# Patient Record
Sex: Male | Born: 1935 | Race: White | Hispanic: No | Marital: Single | Smoking: Former smoker
Health system: Southern US, Community
[De-identification: ages and names within clinical notes are randomized; demographics above are authoritative.]

## PROBLEM LIST (undated history)

## (undated) DIAGNOSIS — R011 Cardiac murmur, unspecified: Secondary | ICD-10-CM

## (undated) DIAGNOSIS — M1990 Unspecified osteoarthritis, unspecified site: Secondary | ICD-10-CM

## (undated) DIAGNOSIS — I509 Heart failure, unspecified: Secondary | ICD-10-CM

## (undated) HISTORY — DX: Heart failure, unspecified: I50.9

## (undated) HISTORY — DX: Cardiac murmur, unspecified: R01.1

## (undated) HISTORY — DX: Unspecified osteoarthritis, unspecified site: M19.90

---

## 1999-03-09 HISTORY — PX: ARTERIAL BYPASS SURGRY: SHX557

## 1999-03-09 HISTORY — PX: TOTAL HIP ARTHROPLASTY: SHX124

## 2006-07-08 ENCOUNTER — Ambulatory Visit: Payer: Self-pay | Admitting: Cardiology

## 2006-07-08 ENCOUNTER — Inpatient Hospital Stay (HOSPITAL_COMMUNITY): Admission: AD | Admit: 2006-07-08 | Discharge: 2006-07-10 | Payer: Self-pay | Admitting: Gastroenterology

## 2006-07-09 ENCOUNTER — Ambulatory Visit: Payer: Self-pay | Admitting: Internal Medicine

## 2006-07-14 ENCOUNTER — Ambulatory Visit: Payer: Self-pay | Admitting: Gastroenterology

## 2007-03-09 HISTORY — PX: MEDIAL PARTIAL KNEE REPLACEMENT: SHX5965

## 2008-09-05 DEATH — deceased

## 2008-11-30 IMAGING — RF DG ERCP WO/W SPHINCTEROTOMY
1 series · 9 of 9 positions shown · non-contrast
Comparison: none

CLINICAL DATA: None given. 
 ERCP WITH SPHINCTEROTOMY AND BALLOON SWEEP:

[Series 1: run · 9 of 9 slices shown]
[im 1/9]
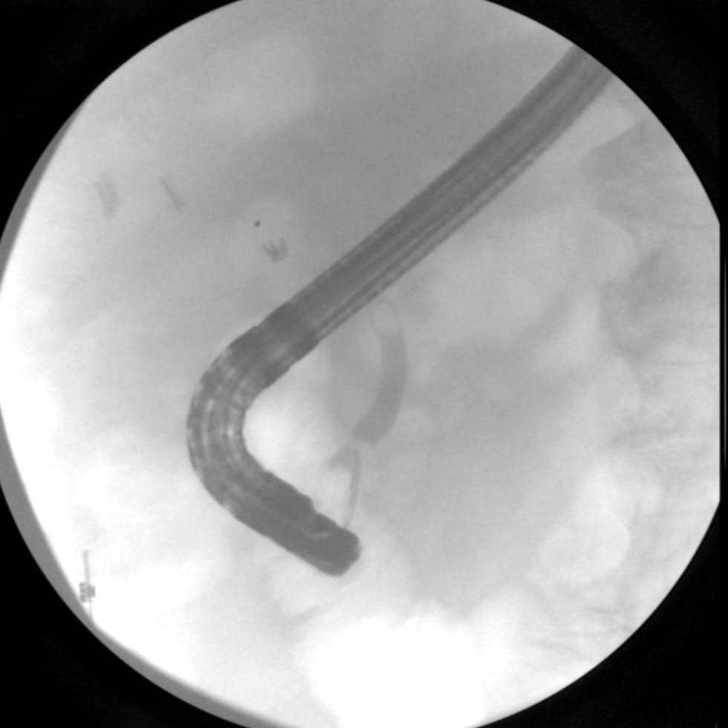
[im 2/9]
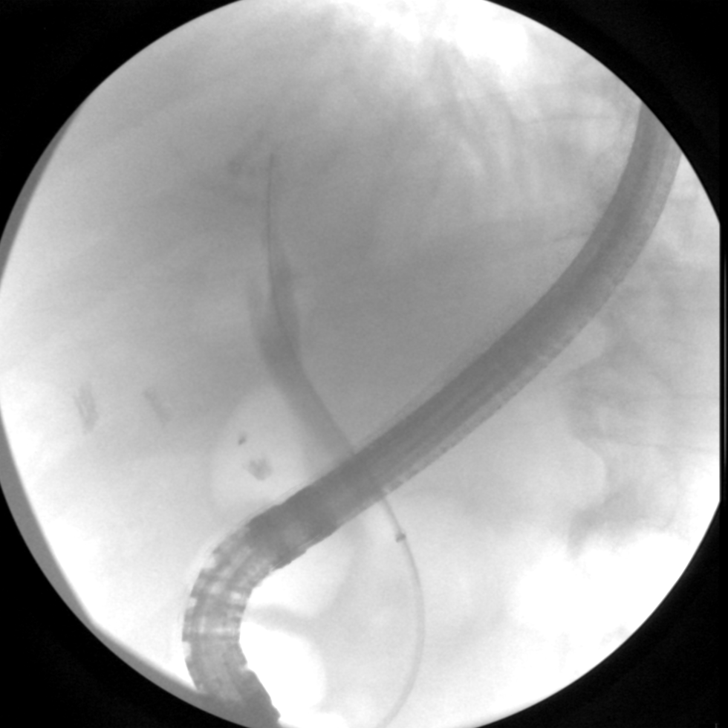
[im 3/9]
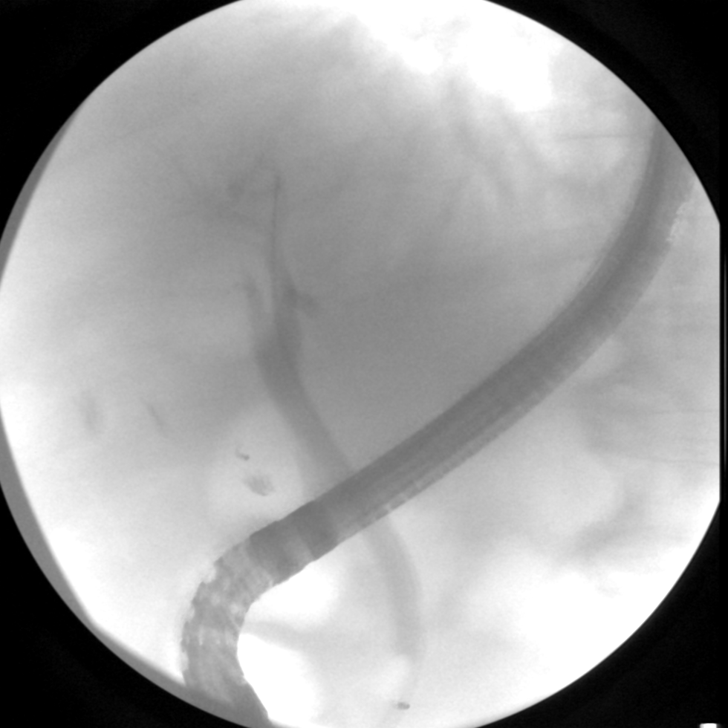
[im 4/9]
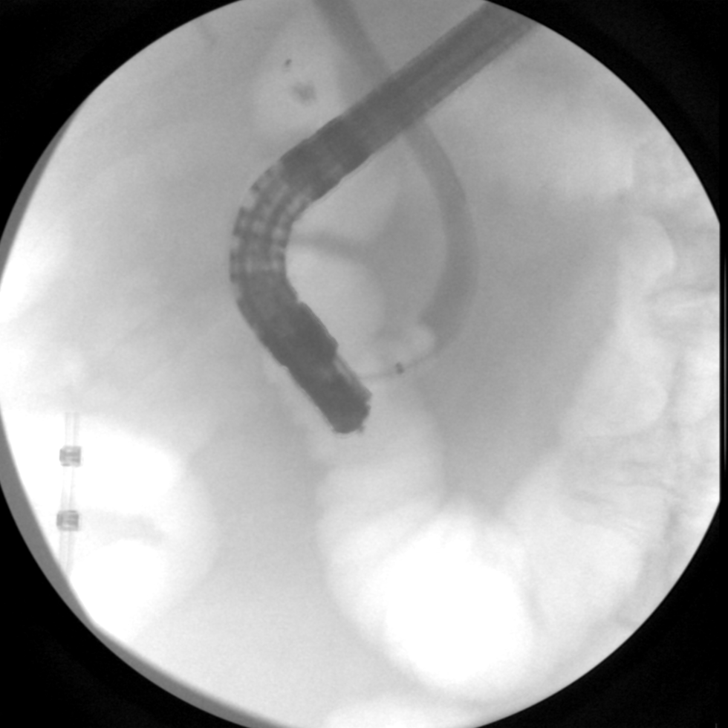
[im 5/9]
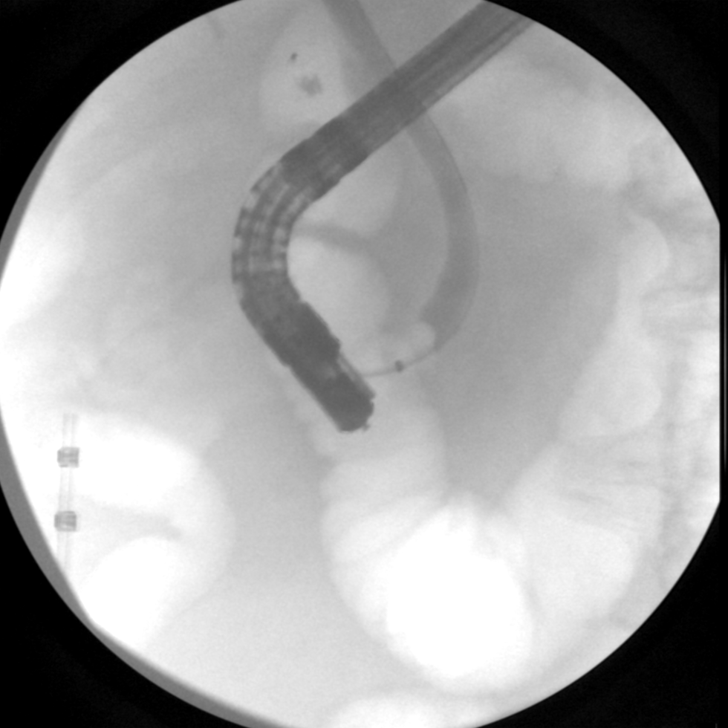
[im 6/9]
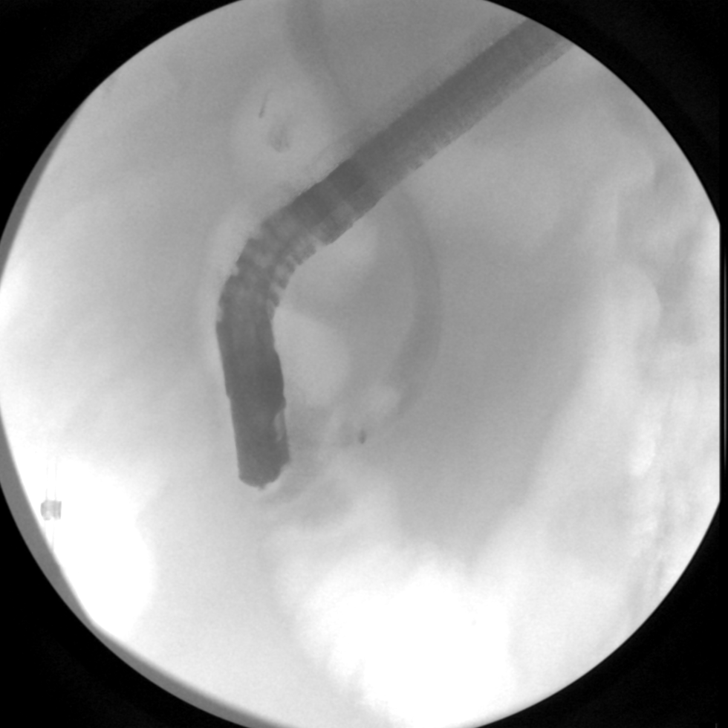
[im 7/9]
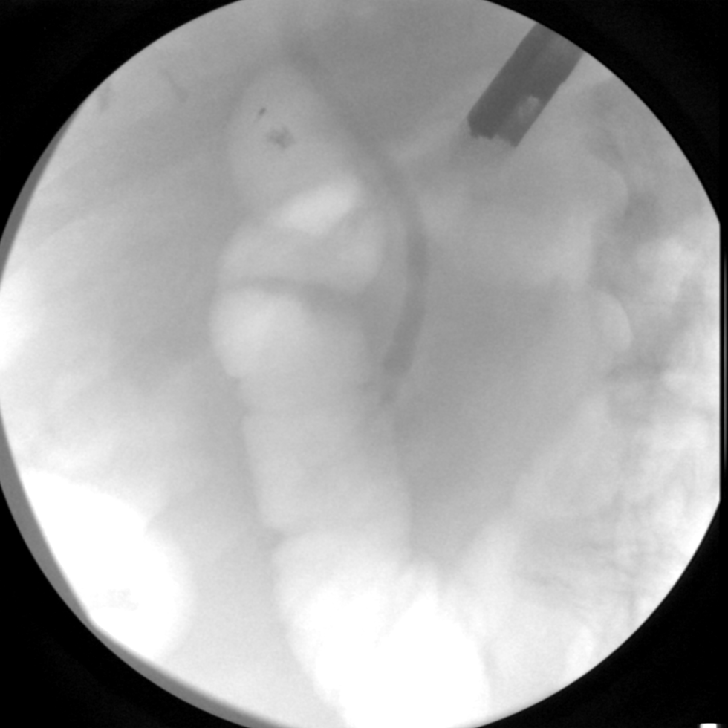
[im 8/9]
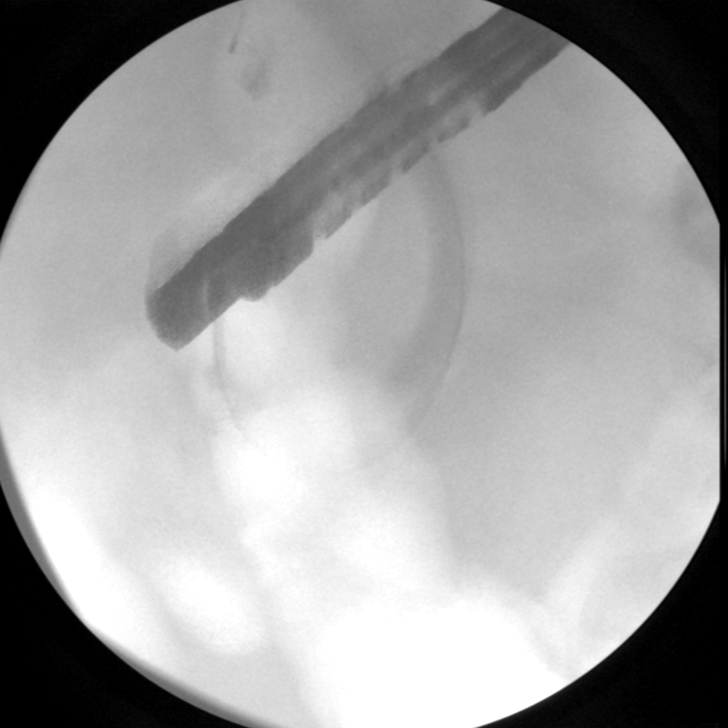
[im 9/9]
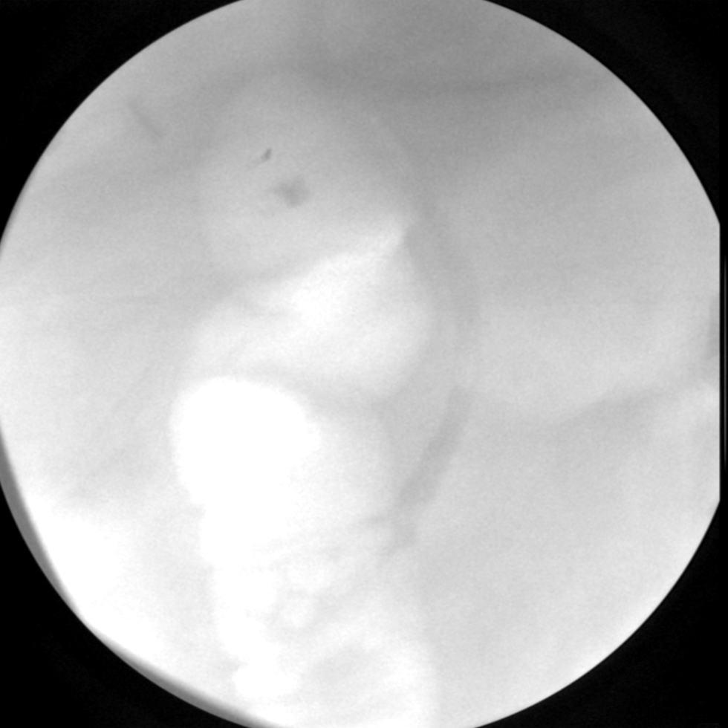

[9 of 9 positions shown; findings below may reference images not displayed]

FINDINGS: Fluoroscopy was provided for Eiland. Bambucafe Tarla during ERCP.  
 Balloon sweeping of the common bile duct was performed. On images obtained after balloon extraction, no obstruction is evident. There are a few air bubbles present.
IMPRESSION: ERCP with sphincterotomy and balloon sweeping.

## 2013-10-29 ENCOUNTER — Ambulatory Visit (INDEPENDENT_AMBULATORY_CARE_PROVIDER_SITE_OTHER): Payer: Medicare Other | Admitting: Cardiovascular Disease

## 2013-10-29 ENCOUNTER — Other Ambulatory Visit (HOSPITAL_COMMUNITY): Payer: Medicare Other

## 2013-10-29 ENCOUNTER — Encounter: Payer: Self-pay | Admitting: Cardiovascular Disease

## 2013-10-29 VITALS — BP 128/75 | HR 103 | Ht 67.0 in | Wt 186.0 lb

## 2013-10-29 DIAGNOSIS — I4891 Unspecified atrial fibrillation: Secondary | ICD-10-CM

## 2013-10-29 DIAGNOSIS — Z951 Presence of aortocoronary bypass graft: Secondary | ICD-10-CM | POA: Insufficient documentation

## 2013-10-29 DIAGNOSIS — Z01810 Encounter for preprocedural cardiovascular examination: Secondary | ICD-10-CM | POA: Insufficient documentation

## 2013-10-29 DIAGNOSIS — I4821 Permanent atrial fibrillation: Secondary | ICD-10-CM | POA: Insufficient documentation

## 2013-10-29 DIAGNOSIS — Z01818 Encounter for other preprocedural examination: Secondary | ICD-10-CM

## 2013-10-29 DIAGNOSIS — R011 Cardiac murmur, unspecified: Secondary | ICD-10-CM

## 2013-10-29 MED ORDER — METOPROLOL TARTRATE 25 MG PO TABS: 25.0000 mg | ORAL_TABLET | Freq: Two times a day (BID) | ORAL | Status: DC

## 2013-10-29 MED ORDER — DILTIAZEM HCL ER COATED BEADS 120 MG PO TB24: 120.0000 mg | ORAL_TABLET | Freq: Every day | ORAL | Status: DC

## 2013-10-29 MED ORDER — METOPROLOL TARTRATE 50 MG PO TABS: 50.0000 mg | ORAL_TABLET | Freq: Two times a day (BID) | ORAL | Status: DC

## 2013-10-29 NOTE — Assessment & Plan Note (Signed)
Activity limited by him pain Moderate risk ortho surgery Exertional dyspnea with abnormal ECG inferior T wave changes  F/U lexiscan myovue

## 2013-10-29 NOTE — Assessment & Plan Note (Signed)
AV sclerosis However has history of CAD, ? CHF, Afib and is preop  Will check echo especially for EF

## 2013-10-29 NOTE — Patient Instructions (Addendum)
Your physician recommends that you schedule a follow-up appointment in: November  WITH  DR [facility] Your physician has recommended you make the following change in your medication: CARDIZEM 120 MG  Your physician has requested that you have a lexiscan myoview. For further information please visit [email or web address] Please follow instruction sheet, as given.   Your physician has requested that you have an echocardiogram. Echocardiography is a painless test that uses sound waves to create images of your heart. It provides your doctor with information about the size and shape of your heart and how well your heart's chambers and valves are working. This procedure takes approximately one hour. There are no restrictions for this procedure.

## 2013-10-29 NOTE — Assessment & Plan Note (Signed)
Needs better rate control especially preop with CAD  Increase beta blocker to bid and add cardizem  Long discussion with him about the cumulative risk of stroke  He absolutely refuses to consider coumadin or NOAC long term  Understands that he will be on them short term for DVT prophylaxis with ortho surgery

## 2013-10-29 NOTE — Progress Notes (Signed)
Patient ID: Douglas Hicks, male   DOB: 03-01-1931, 80 y.o.   MRN: [id number]    80 yo referred for preop clearance  Unfortunately we have no records He needs repeat right hip surgery . Had right knee surgery in early 2000 and hip surgery after.  No cardiac complications.  He indicates having afib for years.  Refuses to take anticoagulation.  Is on beta blocker for rate control but in office at rest pulse is 105.  Has CAD and had CABG in 2009  Indicates history of CHF.  About a month ago fell and right hip much worse  Came to clinic using crutches.  Needs clarence for surgery of right hip 9/22  Activity is limited By pain  Mild exertional dyspnea no chest pain Does not note palpitations or being in afib  Compliant with meds.  Does not like regular f/u with MD;s  No previous stroke , DVT bleeding diathesis or anesthetic problem.       ROS: Denies fever, malais, weight loss, blurry vision, decreased visual acuity, cough, sputum, SOB, hemoptysis, pleuritic pain, palpitaitons, heartburn, abdominal pain, melena, lower extremity edema, claudication, or rash.  All other systems reviewed and negative   General: Affect appropriate Healthy:  appears stated age HEENT: normal Neck supple with no adenopathy JVP normal no bruits no thyromegaly Lungs clear with no wheezing and good diaphragmatic motion Heart:  S1/S2 SEM  murmur,rub, gallop or click PMI normal Abdomen: benighn, BS positve, no tenderness, no AAA no bruit.  No HSM or HJR Distal pulses intact with no bruits No edema Neuro non-focal Skin warm and dry No muscular weakness  Medications Current Outpatient Prescriptions  Medication Sig Dispense Refill  . acetaminophen (TYLENOL) 325 MG tablet Take 650 mg by mouth every 6 (six) hours as needed.      Marland Kitchen aspirin 325 MG EC tablet Take 325 mg by mouth daily.      Marland Kitchen desonide (DESOWEN) 0.05 % cream Apply 1 application topically 2 (two) times daily.      Marland Kitchen diltiazem (CARDIZEM LA) 120 MG 24 hr  tablet Take 1 tablet (120 mg total) by mouth daily.  30 tablet  11  . fluocinonide cream (LIDEX) 0.05 % Apply 1 application topically 2 (two) times daily.      Marland Kitchen guaiFENesin (ROBITUSSIN) 100 MG/5ML liquid Take 100 mg by mouth at bedtime.      . hydroxypropyl methylcellulose (ISOPTO TEARS) 2.5 % ophthalmic solution Place into the right eye 2 (two) times daily.      . metoprolol tartrate (LOPRESSOR) 50 MG tablet Take 1 tablet (50 mg total) by mouth 2 (two) times daily.      Marland Kitchen omeprazole (PRILOSEC) 20 MG capsule Take 20 mg by mouth daily.      . valsartan (DIOVAN) 40 MG tablet Take 20 mg by mouth 2 (two) times daily.       No current facility-administered medications for this visit.    Allergies Review of patient's allergies indicates not on file.  Family History: No family history on file.  Social History: History   Social History  . Marital Status: Single    Spouse Name: N/A    Number of Children: N/A  . Years of Education: N/A   Occupational History  . Not on file.   Social History Main Topics  . Smoking status: Not on file  . Smokeless tobacco: Not on file  . Alcohol Use: Not on file  . Drug Use: Not on file  .  Sexual Activity: Not on file   Other Topics Concern  . Not on file   Social History Narrative  . No narrative on file    Electrocardiogram: afib rate 103  Inferior T wave changes   Assessment and Plan

## 2013-10-31 ENCOUNTER — Ambulatory Visit (HOSPITAL_COMMUNITY): Payer: Medicare Other | Attending: Cardiovascular Disease | Admitting: Radiology

## 2013-10-31 ENCOUNTER — Telehealth: Payer: Self-pay | Admitting: *Deleted

## 2013-10-31 ENCOUNTER — Telehealth: Payer: Self-pay | Admitting: Cardiovascular Disease

## 2013-10-31 ENCOUNTER — Other Ambulatory Visit: Payer: Self-pay

## 2013-10-31 ENCOUNTER — Other Ambulatory Visit: Payer: Self-pay | Admitting: *Deleted

## 2013-10-31 ENCOUNTER — Encounter: Payer: Self-pay | Admitting: Cardiovascular Disease

## 2013-10-31 ENCOUNTER — Ambulatory Visit (HOSPITAL_BASED_OUTPATIENT_CLINIC_OR_DEPARTMENT_OTHER): Payer: Medicare Other | Admitting: Radiology

## 2013-10-31 VITALS — BP 152/104 | HR 83 | Ht 67.0 in | Wt 184.0 lb

## 2013-10-31 DIAGNOSIS — R011 Cardiac murmur, unspecified: Secondary | ICD-10-CM

## 2013-10-31 DIAGNOSIS — I4891 Unspecified atrial fibrillation: Secondary | ICD-10-CM

## 2013-10-31 DIAGNOSIS — I2510 Atherosclerotic heart disease of native coronary artery without angina pectoris: Secondary | ICD-10-CM | POA: Diagnosis present

## 2013-10-31 DIAGNOSIS — Z01818 Encounter for other preprocedural examination: Secondary | ICD-10-CM

## 2013-10-31 DIAGNOSIS — R0602 Shortness of breath: Secondary | ICD-10-CM

## 2013-10-31 DIAGNOSIS — Z01810 Encounter for preprocedural cardiovascular examination: Secondary | ICD-10-CM | POA: Insufficient documentation

## 2013-10-31 DIAGNOSIS — Z951 Presence of aortocoronary bypass graft: Secondary | ICD-10-CM

## 2013-10-31 MED ORDER — DILTIAZEM HCL ER COATED BEADS 120 MG PO CP24: 120.0000 mg | ORAL_CAPSULE | Freq: Every day | ORAL | Status: DC

## 2013-10-31 MED ORDER — TECHNETIUM TC 99M SESTAMIBI GENERIC - CARDIOLITE
33.0000 | Freq: Once | INTRAVENOUS | Status: AC | PRN
  Administered 2013-10-31: 33 via INTRAVENOUS

## 2013-10-31 MED ORDER — TECHNETIUM TC 99M SESTAMIBI GENERIC - CARDIOLITE
11.0000 | Freq: Once | INTRAVENOUS | Status: AC | PRN
  Administered 2013-10-31: 11 via INTRAVENOUS

## 2013-10-31 MED ORDER — REGADENOSON 0.4 MG/5ML IV SOLN
0.4000 mg | Freq: Once | INTRAVENOUS | Status: AC
  Administered 2013-10-31: 0.4 mg via INTRAVENOUS

## 2013-10-31 NOTE — Telephone Encounter (Signed)
Pt daughther is here with dad for testing. Wanted to know about meds that were suppose to be called in and have not yet been called. Please call or she said she was going to be in waiting room.

## 2013-10-31 NOTE — Telephone Encounter (Signed)
INS REQUIRED PRIOR AUTH  FOR  CARDIZEM LA  DISCUSSED  WITH PHARMACY  MED  WOULD  BE $130.OO  PER  MONTH  BUT INS  WOULD  COVER   DILTIAZEM  CD  120 MG   AT  A  COST   OF  ABOUT  $22.00  PER  MONTH  REVIEWED WITH DR Eden Emms  MAY   SWITCH  MED  TO  DILTIAZEM CD  NEW  SCRIPT  SENT  VIA  EPIC  PT'S  DAUGHTER  AWARE .Zack Seal

## 2013-10-31 NOTE — Progress Notes (Signed)
Echocardiogram performed.  

## 2013-10-31 NOTE — Telephone Encounter (Signed)
UNABLE  TO LEAVE  MESSAGE PER  DR NISHAN PT  MAY PROCEED WITH  HIP  SURGERY  ECHO  STABLE SINCE  2008  AND  MYOVIEW  NO  ISCHEMIA   NEEDS  CARDIOLOGY  TO  SEE  POST OP  AND  PT   REALLY  NEEDS  TO VOICE  THIS  PT  NOT  HAVING SURGERY  AT  [facility]   DR  NISHAN  WOULD LIKE PT    TO  HAVE  COPY  OF  OFFICE  VISIT  ,ECHO , AND  MYOVIEW   ATTEMPTED   TO  INFORM DAUGHTER    WILL  FORWARD   MESSAGE  TO TRY  AGAIN Douglas Hicks .Douglas Hicks

## 2013-10-31 NOTE — Progress Notes (Signed)
[facility] SITE 3 NUCLEAR MED [address] [place], [place] [address] [phone]    Cardiology Nuclear Med Study  Douglas Hicks is a 80 y.o. male     MRN : [id number]     DOB: [birth year]  Procedure Date: 10/31/2013  Nuclear Med Background Indication for Stress Test:  Evaluation for Ischemia and Surgical Clearance- Hip surgery @ [facility] History:  CAD, CABG 2009, Afib, hx. CHF Cardiac Risk Factors: History of Smoking  Symptoms:  DOE and Palpitations   Nuclear Pre-Procedure Caffeine/Decaff Intake:  None NPO After: 8:00pm   Lungs:  clear O2 Sat: 96% on room air. IV 0.9% NS with Angio Cath:  22g  IV Site: R Hand  IV Started by:  Bonnita Levan, RN  Chest Size (in):  44 Cup Size: n/a  Height:  (1.702 m)  Weight:  184 lb (83.462 kg)  BMI:  Body mass index is 28.81 kg/(m^2). Tech Comments:  N/A    Nuclear Med Study 1 or 2 day study: 1 day  Stress Test Type:  Lexiscan  Reading MD: N/A  Order Authorizing Provider:  Charlton Haws, MD  Resting Radionuclide: Technetium 46m Sestamibi  Resting Radionuclide Dose: 11.0 mCi   Stress Radionuclide:  Technetium 78m Sestamibi  Stress Radionuclide Dose: 33.0 mCi           Stress Protocol Rest HR: 83 Stress HR: 107  Rest BP: 152/104 Stress BP: 152/89  Exercise Time (min): n/a METS: n/a           Dose of Adenosine (mg):  n/a Dose of Lexiscan: 0.4 mg  Dose of Atropine (mg): n/a Dose of Dobutamine: n/a mcg/kg/min (at max HR)  Stress Test Technologist: Nelson Chimes, BS-ES  Nuclear Technologist:  Harlow Asa, CNMT     Rest Procedure:  Myocardial perfusion imaging was performed at rest 45 minutes following the intravenous administration of Technetium 60m Sestamibi. Rest ECG: Atrial Fibrilliation  Stress Procedure:  The patient received IV Lexiscan 0.4 mg over 15-seconds.  Technetium 21m Sestamibi injected at 30-seconds.  Quantitative spect images were obtained after a 45 minute delay.  During the infusion of  Lexiscan the patient complained of feeling funny.  This resolved in recovery.  Stress ECG: No significant change from baseline ECG  QPS Raw Data Images:  Normal; no motion artifact; normal heart/lung ratio. Stress Images:  Large, severe inferior and basal inferolateral perfusion defect. Rest Images:  Large, severe inferior and basal inferolateral perfusion defect Subtraction (SDS):  Fixed, large, severe inferior and basal inferolateral perfusion defect Transient Ischemic Dilatation (Normal <1.22):  1.06 Lung/Heart Ratio (Normal <0.45):  0.38  Quantitative Gated Spect Images QGS EDV:  143 ml QGS ESV:  106 ml  Impression Exercise Capacity:  Lexiscan with no exercise. BP Response:  Normal blood pressure response. Clinical Symptoms:  "Feels funny" ECG Impression:  Atrial fibrillation, no ischemic changes.  Comparison with Prior Nuclear Study: No images to compare  Overall Impression:  Intermediate risk stress nuclear study with large, severe inferior and basal inferolateral perfusion defect that was fixed.  No ischemia.  EF 25% with diffuse hypokinesis and inferior akinesis. .  LV Ejection Fraction: 25%.  LV Wall Motion:  Diffuse hypokinesis with inferior akinesis.   [name] 10/31/2013

## 2013-11-01 NOTE — Telephone Encounter (Signed)
Completed by Carlyon Shadow, RN

## 2013-11-01 NOTE — Telephone Encounter (Addendum)
Spoke with patient's daughter. Advised as noted below that he needs to see cardiology post op surgery in [place]. She will pick up copies of the office note and study today. Left at front desk. Wants to know if clearance letter was faxed to [facility]. Found clearance letter and faxed to Tina at [phone] along with copy of last office note and echo. Also left copy of this clearance for daughter to pick up.

## 2013-11-01 NOTE — Telephone Encounter (Signed)
Patient's daughter is returning call. Please call and advise.  

## 2013-11-07 ENCOUNTER — Encounter: Payer: Self-pay | Admitting: Cardiovascular Disease

## 2014-01-21 ENCOUNTER — Ambulatory Visit: Payer: Medicare Other | Admitting: Cardiovascular Disease

## 2014-05-29 DIAGNOSIS — M1612 Unilateral primary osteoarthritis, left hip: Secondary | ICD-10-CM | POA: Diagnosis not present

## 2014-05-29 DIAGNOSIS — Z96642 Presence of left artificial hip joint: Secondary | ICD-10-CM | POA: Diagnosis not present

## 2018-08-07 DEATH — deceased

## 2021-07-02 ENCOUNTER — Other Ambulatory Visit: Payer: Self-pay | Admitting: Neurosurgery

## 2021-07-07 ENCOUNTER — Encounter (HOSPITAL_COMMUNITY): Payer: Self-pay | Admitting: Neurosurgery

## 2021-07-07 NOTE — Anesthesia Preprocedure Evaluation (Addendum)
Anesthesia Evaluation  ?Patient identified by MRN, date of birth, ID band ?Patient awake ? ? ? ?Reviewed: ?Allergy & Precautions, NPO status , Patient's Chart, lab work & pertinent test results, reviewed documented beta blocker date and time  ? ?History of Anesthesia Complications ?Negative for: history of anesthetic complications ? ?Airway ?Mallampati: II ? ?TM Distance: >3 FB ?Neck ROM: Full ? ? ? Dental ? ?(+) Edentulous Lower, Edentulous Upper ?  ?Pulmonary ?former smoker,  ?  ?Pulmonary exam normal ? ? ? ? ? ? ? Cardiovascular ?+ CAD, + CABG and +CHF  ?Normal cardiovascular exam+ dysrhythmias Atrial Fibrillation  ? ? ?'23 TTE - EF 30-35%. Moderate global hypokinesis of the left ventricle. Inferior wall akinesis. Diastolic dysfunction, Grade III (restrictive pattern). RV function appears depressed. Both atria are mildly dilated. Mild AI.  ? ?  ?Neuro/Psych ?negative neurological ROS ? negative psych ROS  ? GI/Hepatic ?negative GI ROS, Neg liver ROS,   ?Endo/Other  ?negative endocrine ROS ? Renal/GU ?negative Renal ROS  ? ?  ?Musculoskeletal ? ?(+) Arthritis ,  ? Abdominal ?  ?Peds ? Hematology ?negative hematology ROS ?(+)   ?Anesthesia Other Findings ? ? Reproductive/Obstetrics ? ?  ? ? ? ? ? ? ? ? ? ? ? ? ? ?  ?  ? ? ? ? ? ? ?Anesthesia Physical ?Anesthesia Plan ? ?ASA: 3 ? ?Anesthesia Plan: General  ? ?Post-op Pain Management: Tylenol PO (pre-op)*  ? ?Induction: Intravenous ? ?PONV Risk Score and Plan: 2 and Treatment may vary due to age or medical condition, Ondansetron and Propofol infusion ? ?Airway Management Planned: Oral ETT ? ?Additional Equipment:  ? ?Intra-op Plan:  ? ?Post-operative Plan: Extubation in OR ? ?Informed Consent: I have reviewed the patients History and Physical, chart, labs and discussed the procedure including the risks, benefits and alternatives for the proposed anesthesia with the patient or authorized representative who has indicated his/her  understanding and acceptance.  ? ? ? ?Dental advisory given ? ?Plan Discussed with: CRNA and Anesthesiologist ? ?Anesthesia Plan Comments: (Plan for [facility] ?)  ? ? ? ? ?Anesthesia Quick Evaluation ? ?

## 2021-07-07 NOTE — Progress Notes (Signed)
Anesthesia Chart Review: ?Same-day work-up ? ?Follows with cardiology at the [facility] for history of CAD s/p CABG, ischemic cardiomyopathy (EF 30 to 35% on echo 06/26/2021, prior echo 11/28/2017 showed EF 20 to 25%), Permanent AF on Xarelto, HTN, HLD.  Seen by Dr. Stevphen Rochester 06/19/2021 for preop evaluation.  Updated echo was ordered to reevaluate LV function and cardiac valves.  Note was addended after completion of echo and patient was cleared.  Per note, "06/26/2021 ADDENDUM STATUS: COMPLETED Formal echo completed, demonstrated EF 30-35%, grade III diastolic dysfunction, inferior wall akinesis. Called to provide the results of his study but I was unable to reach the veteran.  Looking back at his weight trend, he is down nearly 20 lbs from 2017. Clinically, he appeared euvolemic in clinic. We continued his BB and he was started on an ARB for better BP control. Given his echo findings, I would consider him to be moderate risk for peri-operative risk of major adverse cardiac event or death. However, there is no further intervention or medical optimization from a cardiac perspective at this point in time." ? ?Per pt's wife, pt not taking Xarelto.  ? ?Patient will need day of surgery labs and evaluation. ? ?EKG 06/19/2021 (per cardiology note same date): Atrial fibrillation, rate controlled 81, inferolateral T wave changes. ? ?TTE 06/26/2021 (Care Everywhere): ?Summary Statements ? ?The left ventricle is normal in size. ?There is normal left ventricular wall thickness. ? ?Ejection Fraction = 30-35% (Visual Estimation). ?EF= 34% by 2D biplane method. ?There is moderate global hypokinesis of the left ventricle. ? ?Inferior wall akinesis. ?Diastolic dysfunction, Grade III (restrictive pattern), consistent with ? ?markedly increased left atrial pressure. ?RV function appears depressed. ?TAPSE is 1.3 cm. ? ?The left atrium is mildly dilated. ?The right atrium is mildly dilated. ?There is moderate aortic valve thickening. ?No  hemodynamically significant valvular aortic stenosis. ? ?Mild aortic regurgitation. ?There is mild mitral annular calcification. ?Right ventricular systolic pressure is normal. ?The aortic root is normal size. ? ?There is no pericardial effusion. ? ? ? ? ?Antionette Poles, PA-C ?[facility] Short Stay [facility]/Anesthesiology ?Phone [phone] ?07/07/2021 11:18 AM ? ?

## 2021-07-07 NOTE — Progress Notes (Signed)
PCP - Dr. Jethro Poling at [facility]  ?Cardiologist -  ?EKG - DOS ?Chest x-ray -  ?ECHO -  ?Cardiac Cath -  ? ?Blood Thinner Instructions:  ?Aspirin Instructions: per pt wife 07/03/21 ?ERAS Protcol - n/a ?COVID TEST- n/a ? ?Anesthesia review: n/a ? ?------------- ? ?SDW INSTRUCTIONS: ? ?Your procedure is scheduled on Wednesday 5/3. Please report to [facility] Main Entrance "A" at 0630 A.M., and check in at the Admitting office. Call this number if you have problems the morning of surgery: [phone] ? ? ?Remember: Do not eat or drink after midnight the night before your surgery ?  ?Medications to take morning of surgery with a sip of water include: ?metoprolol (TOPROL-XL)  ?acetaminophen (TYLENOL)  if needed ? ?As of today, STOP taking any Aleve, Naproxen, Ibuprofen, Motrin, Advil, Goody's, BC's, all herbal medications, fish oil, and all vitamins. ? ?  ?The Morning of Surgery ?Do not wear jewelry ?Do not wear lotions, powders, colognes, or deodorant ?Do not bring valuables to the hospital. ?[facility] is not responsible for any belongings or valuables. ? ?If you are a smoker, DO NOT Smoke 24 hours prior to surgery ? ?If you wear a CPAP at night please bring your mask the morning of surgery  ? ?Remember that you must have someone to transport you home after your surgery, and remain with you for 24 hours if you are discharged the same day. ? ?Please bring cases for contacts, glasses, hearing aids, dentures or bridgework because it cannot be worn into surgery.  ? ?Patients discharged the day of surgery will not be allowed to drive home.  ? ?Please shower the NIGHT BEFORE/MORNING OF SURGERY (use antibacterial soap like DIAL soap if possible). Wear comfortable clothes the morning of surgery. Oral Hygiene is also important to reduce your risk of infection.  Remember - BRUSH YOUR TEETH THE MORNING OF SURGERY WITH YOUR REGULAR TOOTHPASTE ? ?Patient denies shortness of breath, fever, cough and chest pain.  ? ? ?   ? ?

## 2021-07-08 ENCOUNTER — Ambulatory Visit (HOSPITAL_COMMUNITY): Payer: No Typology Code available for payment source | Admitting: Physician Assistant

## 2021-07-08 ENCOUNTER — Encounter (HOSPITAL_COMMUNITY)
Admission: RE | Disposition: A | Discharge status: Home / Self Care | Payer: Self-pay | Source: Home / Self Care | Attending: Neurosurgery

## 2021-07-08 ENCOUNTER — Ambulatory Visit (HOSPITAL_COMMUNITY): Payer: No Typology Code available for payment source

## 2021-07-08 ENCOUNTER — Encounter (HOSPITAL_COMMUNITY): Payer: Self-pay | Admitting: Neurosurgery

## 2021-07-08 ENCOUNTER — Ambulatory Visit (HOSPITAL_COMMUNITY)
Admission: RE | Admit: 2021-07-08 | Discharge: 2021-07-09 | Disposition: A | Discharge status: Home / Self Care | Payer: No Typology Code available for payment source | Attending: Neurosurgery | Admitting: Neurosurgery

## 2021-07-08 ENCOUNTER — Ambulatory Visit (HOSPITAL_BASED_OUTPATIENT_CLINIC_OR_DEPARTMENT_OTHER): Payer: No Typology Code available for payment source | Admitting: Physician Assistant

## 2021-07-08 ENCOUNTER — Other Ambulatory Visit: Payer: Self-pay

## 2021-07-08 DIAGNOSIS — I2510 Atherosclerotic heart disease of native coronary artery without angina pectoris: Secondary | ICD-10-CM

## 2021-07-08 DIAGNOSIS — M5416 Radiculopathy, lumbar region: Secondary | ICD-10-CM | POA: Insufficient documentation

## 2021-07-08 DIAGNOSIS — Z951 Presence of aortocoronary bypass graft: Secondary | ICD-10-CM | POA: Diagnosis not present

## 2021-07-08 DIAGNOSIS — M48062 Spinal stenosis, lumbar region with neurogenic claudication: Secondary | ICD-10-CM

## 2021-07-08 DIAGNOSIS — Z87891 Personal history of nicotine dependence: Secondary | ICD-10-CM | POA: Diagnosis not present

## 2021-07-08 DIAGNOSIS — I509 Heart failure, unspecified: Secondary | ICD-10-CM | POA: Diagnosis not present

## 2021-07-08 HISTORY — PX: LUMBAR LAMINECTOMY/DECOMPRESSION MICRODISCECTOMY: SHX5026

## 2021-07-08 LAB — POCT I-STAT, CHEM 8
BUN: 23 mg/dL (ref 8–23)
Calcium, Ion: 1.22 mmol/L (ref 1.15–1.40)
Chloride: 105 mmol/L (ref 98–111)
Creatinine, Ser: 0.8 mg/dL (ref 0.61–1.24)
Glucose, Bld: 134 mg/dL — ABNORMAL HIGH (ref 70–99)
HCT: 49 % (ref 39.0–52.0)
Hemoglobin: 16.7 g/dL (ref 13.0–17.0)
Potassium: 4.8 mmol/L (ref 3.5–5.1)
Sodium: 139 mmol/L (ref 135–145)
TCO2: 27 mmol/L (ref 22–32)

## 2021-07-08 LAB — SURGICAL PCR SCREEN
MRSA, PCR: NEGATIVE
Staphylococcus aureus: POSITIVE — AB

## 2021-07-08 SURGERY — LUMBAR LAMINECTOMY/DECOMPRESSION MICRODISCECTOMY 2 LEVELS
Anesthesia: General | Site: Spine Lumbar | Laterality: Bilateral

## 2021-07-08 MED ORDER — LACTATED RINGERS IV SOLN: INTRAVENOUS | Status: DC

## 2021-07-08 MED ORDER — PHENYLEPHRINE 80 MCG/ML (10ML) SYRINGE FOR IV PUSH (FOR BLOOD PRESSURE SUPPORT)
PREFILLED_SYRINGE | INTRAVENOUS | Status: DC | PRN
Start: 2021-07-08 — End: 2021-07-08
  Administered 2021-07-08: 40 ug via INTRAVENOUS
  Administered 2021-07-08: 80 ug via INTRAVENOUS

## 2021-07-08 MED ORDER — FENTANYL CITRATE (PF) 250 MCG/5ML IJ SOLN
INTRAMUSCULAR | Status: AC
  Filled 2021-07-08: qty 5

## 2021-07-08 MED ORDER — CHLORHEXIDINE GLUCONATE 0.12 % MT SOLN
OROMUCOSAL | Status: AC
  Administered 2021-07-08: 15 mL via OROMUCOSAL
  Filled 2021-07-08: qty 15

## 2021-07-08 MED ORDER — BACITRACIN ZINC 500 UNIT/GM EX OINT
TOPICAL_OINTMENT | CUTANEOUS | Status: AC
  Filled 2021-07-08: qty 28.35

## 2021-07-08 MED ORDER — 0.9 % SODIUM CHLORIDE (POUR BTL) OPTIME
TOPICAL | Status: DC | PRN
  Administered 2021-07-08: 1000 mL

## 2021-07-08 MED ORDER — BUPIVACAINE-EPINEPHRINE (PF) 0.5% -1:200000 IJ SOLN
INTRAMUSCULAR | Status: DC | PRN
  Administered 2021-07-08: 20 mL via PERINEURAL

## 2021-07-08 MED ORDER — CEFAZOLIN SODIUM-DEXTROSE 2-4 GM/100ML-% IV SOLN
INTRAVENOUS | Status: AC
  Filled 2021-07-08: qty 100

## 2021-07-08 MED ORDER — CEFAZOLIN SODIUM-DEXTROSE 2-3 GM-%(50ML) IV SOLR
INTRAVENOUS | Status: DC | PRN
  Administered 2021-07-08: 2 g via INTRAVENOUS

## 2021-07-08 MED ORDER — MUPIROCIN 2 % EX OINT
1.0000 "application " | TOPICAL_OINTMENT | Freq: Two times a day (BID) | CUTANEOUS | Status: DC
  Administered 2021-07-08 – 2021-07-09 (×2): 1 via NASAL
  Filled 2021-07-08: qty 22

## 2021-07-08 MED ORDER — MORPHINE SULFATE (PF) 4 MG/ML IV SOLN: 4.0000 mg | INTRAVENOUS | Status: DC | PRN

## 2021-07-08 MED ORDER — ACETAMINOPHEN 500 MG PO TABS
1000.0000 mg | ORAL_TABLET | Freq: Four times a day (QID) | ORAL | Status: AC
  Administered 2021-07-08 – 2021-07-09 (×2): 1000 mg via ORAL
  Filled 2021-07-08 (×3): qty 2

## 2021-07-08 MED ORDER — PROPOFOL 10 MG/ML IV BOLUS
INTRAVENOUS | Status: AC
  Filled 2021-07-08: qty 20

## 2021-07-08 MED ORDER — THROMBIN 5000 UNITS EX SOLR
CUTANEOUS | Status: DC | PRN
  Administered 2021-07-08: 5000 [IU] via TOPICAL

## 2021-07-08 MED ORDER — LIDOCAINE 2% (20 MG/ML) 5 ML SYRINGE
INTRAMUSCULAR | Status: DC | PRN
Start: 2021-07-08 — End: 2021-07-08
  Administered 2021-07-08: 60 mg via INTRAVENOUS

## 2021-07-08 MED ORDER — ONDANSETRON HCL 4 MG/2ML IJ SOLN: 4.0000 mg | Freq: Once | INTRAMUSCULAR | Status: DC | PRN

## 2021-07-08 MED ORDER — SODIUM CHLORIDE 0.9% FLUSH: 3.0000 mL | INTRAVENOUS | Status: DC | PRN

## 2021-07-08 MED ORDER — BACITRACIN ZINC 500 UNIT/GM EX OINT
TOPICAL_OINTMENT | CUTANEOUS | Status: DC | PRN
  Administered 2021-07-08: 1 via TOPICAL

## 2021-07-08 MED ORDER — ROCURONIUM BROMIDE 10 MG/ML (PF) SYRINGE
PREFILLED_SYRINGE | INTRAVENOUS | Status: AC
  Filled 2021-07-08: qty 10

## 2021-07-08 MED ORDER — PROPOFOL 10 MG/ML IV BOLUS
INTRAVENOUS | Status: DC | PRN
Start: 2021-07-08 — End: 2021-07-08
  Administered 2021-07-08: 80 mg via INTRAVENOUS

## 2021-07-08 MED ORDER — CARBOXYMETHYLCELLUL-GLYCERIN 0.5-0.9 % OP SOLN
1.0000 [drp] | Freq: Four times a day (QID) | OPHTHALMIC | Status: DC | PRN
Start: 2021-07-08 — End: 2021-07-08

## 2021-07-08 MED ORDER — SODIUM CHLORIDE 0.9% FLUSH
3.0000 mL | Freq: Two times a day (BID) | INTRAVENOUS | Status: DC
  Administered 2021-07-08: 3 mL via INTRAVENOUS

## 2021-07-08 MED ORDER — ACETAMINOPHEN 500 MG PO TABS
1000.0000 mg | ORAL_TABLET | Freq: Once | ORAL | Status: AC
  Administered 2021-07-08: 1000 mg via ORAL
  Filled 2021-07-08: qty 2

## 2021-07-08 MED ORDER — SODIUM CHLORIDE 0.9 % IV SOLN: 250.0000 mL | INTRAVENOUS | Status: DC

## 2021-07-08 MED ORDER — ROCURONIUM BROMIDE 10 MG/ML (PF) SYRINGE
PREFILLED_SYRINGE | INTRAVENOUS | Status: DC | PRN
  Administered 2021-07-08 (×2): 50 mg via INTRAVENOUS

## 2021-07-08 MED ORDER — CYCLOBENZAPRINE HCL 10 MG PO TABS
10.0000 mg | ORAL_TABLET | Freq: Three times a day (TID) | ORAL | Status: DC | PRN
  Administered 2021-07-08: 10 mg via ORAL
  Filled 2021-07-08: qty 1

## 2021-07-08 MED ORDER — HYDROMORPHONE HCL 2 MG PO TABS
1.0000 mg | ORAL_TABLET | ORAL | Status: DC | PRN
  Filled 2021-07-08: qty 1

## 2021-07-08 MED ORDER — PHENYLEPHRINE HCL-NACL 20-0.9 MG/250ML-% IV SOLN
INTRAVENOUS | Status: DC | PRN
Start: 2021-07-08 — End: 2021-07-08
  Administered 2021-07-08: 20 ug/min via INTRAVENOUS

## 2021-07-08 MED ORDER — THROMBIN 5000 UNITS EX SOLR
CUTANEOUS | Status: AC
  Filled 2021-07-08: qty 5000

## 2021-07-08 MED ORDER — MENTHOL 3 MG MT LOZG: 1.0000 | LOZENGE | OROMUCOSAL | Status: DC | PRN

## 2021-07-08 MED ORDER — CHLORHEXIDINE GLUCONATE 0.12 % MT SOLN
15.0000 mL | OROMUCOSAL | Status: AC
  Filled 2021-07-08: qty 15

## 2021-07-08 MED ORDER — ACETAMINOPHEN 650 MG RE SUPP: 650.0000 mg | RECTAL | Status: DC | PRN

## 2021-07-08 MED ORDER — ACETAMINOPHEN 325 MG PO TABS
650.0000 mg | ORAL_TABLET | ORAL | Status: DC | PRN
  Administered 2021-07-08: 650 mg via ORAL
  Filled 2021-07-08: qty 2

## 2021-07-08 MED ORDER — ONDANSETRON HCL 4 MG/2ML IJ SOLN
INTRAMUSCULAR | Status: AC
  Filled 2021-07-08: qty 2

## 2021-07-08 MED ORDER — LIDOCAINE 2% (20 MG/ML) 5 ML SYRINGE
INTRAMUSCULAR | Status: AC
  Filled 2021-07-08: qty 5

## 2021-07-08 MED ORDER — BUPIVACAINE-EPINEPHRINE 0.5% -1:200000 IJ SOLN
INTRAMUSCULAR | Status: AC
  Filled 2021-07-08: qty 1

## 2021-07-08 MED ORDER — FENTANYL CITRATE (PF) 100 MCG/2ML IJ SOLN
25.0000 ug | INTRAMUSCULAR | Status: DC | PRN
  Administered 2021-07-08: 50 ug via INTRAVENOUS

## 2021-07-08 MED ORDER — METOPROLOL SUCCINATE ER 100 MG PO TB24
100.0000 mg | ORAL_TABLET | Freq: Every day | ORAL | Status: DC
  Administered 2021-07-09: 100 mg via ORAL
  Filled 2021-07-08 (×2): qty 1

## 2021-07-08 MED ORDER — DEXAMETHASONE SODIUM PHOSPHATE 10 MG/ML IJ SOLN
INTRAMUSCULAR | Status: DC | PRN
  Administered 2021-07-08: 10 mg via INTRAVENOUS

## 2021-07-08 MED ORDER — DOCUSATE SODIUM 100 MG PO CAPS
100.0000 mg | ORAL_CAPSULE | Freq: Two times a day (BID) | ORAL | Status: DC
  Administered 2021-07-08: 100 mg via ORAL
  Filled 2021-07-08 (×4): qty 1

## 2021-07-08 MED ORDER — FENTANYL CITRATE (PF) 250 MCG/5ML IJ SOLN
INTRAMUSCULAR | Status: DC | PRN
  Administered 2021-07-08 (×4): 50 ug via INTRAVENOUS

## 2021-07-08 MED ORDER — POLYVINYL ALCOHOL 1.4 % OP SOLN
1.0000 [drp] | OPHTHALMIC | Status: DC | PRN
  Filled 2021-07-08: qty 15

## 2021-07-08 MED ORDER — FENTANYL CITRATE (PF) 100 MCG/2ML IJ SOLN
INTRAMUSCULAR | Status: AC
  Filled 2021-07-08: qty 2

## 2021-07-08 MED ORDER — ONDANSETRON HCL 4 MG/2ML IJ SOLN: 4.0000 mg | Freq: Four times a day (QID) | INTRAMUSCULAR | Status: DC | PRN

## 2021-07-08 MED ORDER — CEFAZOLIN SODIUM-DEXTROSE 2-4 GM/100ML-% IV SOLN
2.0000 g | Freq: Three times a day (TID) | INTRAVENOUS | Status: AC
  Administered 2021-07-08 – 2021-07-09 (×2): 2 g via INTRAVENOUS
  Filled 2021-07-08 (×2): qty 100

## 2021-07-08 MED ORDER — BISACODYL 10 MG RE SUPP: 10.0000 mg | Freq: Every day | RECTAL | Status: DC | PRN

## 2021-07-08 MED ORDER — DEXAMETHASONE SODIUM PHOSPHATE 10 MG/ML IJ SOLN
INTRAMUSCULAR | Status: AC
  Filled 2021-07-08: qty 1

## 2021-07-08 MED ORDER — SUGAMMADEX SODIUM 200 MG/2ML IV SOLN
INTRAVENOUS | Status: DC | PRN
  Administered 2021-07-08: 200 mg via INTRAVENOUS

## 2021-07-08 MED ORDER — ONDANSETRON HCL 4 MG/2ML IJ SOLN
INTRAMUSCULAR | Status: DC | PRN
  Administered 2021-07-08: 4 mg via INTRAVENOUS

## 2021-07-08 MED ORDER — PHENOL 1.4 % MT LIQD: 1.0000 | OROMUCOSAL | Status: DC | PRN

## 2021-07-08 MED ORDER — ONDANSETRON HCL 4 MG PO TABS: 4.0000 mg | ORAL_TABLET | Freq: Four times a day (QID) | ORAL | Status: DC | PRN

## 2021-07-08 SURGICAL SUPPLY — 50 items
BAG COUNTER SPONGE SURGICOUNT (BAG) ×2 IMPLANT
BAG SPNG CNTER NS LX DISP (BAG) ×1
BAND RUBBER #18 3X1/16 STRL (MISCELLANEOUS) ×4 IMPLANT
BENZOIN TINCTURE PRP APPL 2/3 (GAUZE/BANDAGES/DRESSINGS) ×2 IMPLANT
BLADE CLIPPER SURG (BLADE) IMPLANT
BUR MATCHSTICK NEURO 3.0 LAGG (BURR) ×2 IMPLANT
BUR PRECISION FLUTE 6.0 (BURR) ×2 IMPLANT
CANISTER SUCT 3000ML PPV (MISCELLANEOUS) ×2 IMPLANT
CARTRIDGE OIL MAESTRO DRILL (MISCELLANEOUS) ×1 IMPLANT
CLSR STERI-STRIP ANTIMIC 1/2X4 (GAUZE/BANDAGES/DRESSINGS) ×1 IMPLANT
DIFFUSER DRILL AIR PNEUMATIC (MISCELLANEOUS) ×2 IMPLANT
DRAPE LAPAROTOMY 100X72X124 (DRAPES) ×2 IMPLANT
DRAPE MICROSCOPE LEICA (MISCELLANEOUS) ×2 IMPLANT
DRAPE SURG 17X23 STRL (DRAPES) ×8 IMPLANT
DRSG OPSITE POSTOP 4X6 (GAUZE/BANDAGES/DRESSINGS) ×1 IMPLANT
ELECT BLADE 4.0 EZ CLEAN MEGAD (MISCELLANEOUS) ×2
ELECT REM PT RETURN 9FT ADLT (ELECTROSURGICAL) ×2
ELECTRODE BLDE 4.0 EZ CLN MEGD (MISCELLANEOUS) ×1 IMPLANT
ELECTRODE REM PT RTRN 9FT ADLT (ELECTROSURGICAL) ×1 IMPLANT
GAUZE 4X4 16PLY ~~LOC~~+RFID DBL (SPONGE) IMPLANT
GAUZE SPONGE 4X4 12PLY STRL (GAUZE/BANDAGES/DRESSINGS) ×2 IMPLANT
GLOVE BIO SURGEON STRL SZ8 (GLOVE) ×2 IMPLANT
GLOVE BIO SURGEON STRL SZ8.5 (GLOVE) ×2 IMPLANT
GLOVE BIOGEL PI IND STRL 7.5 (GLOVE) IMPLANT
GLOVE BIOGEL PI INDICATOR 7.5 (GLOVE) ×1
GLOVE ECLIPSE 7.0 STRL STRAW (GLOVE) ×1 IMPLANT
GLOVE EXAM NITRILE XL STR (GLOVE) IMPLANT
GOWN STRL REUS W/ TWL LRG LVL3 (GOWN DISPOSABLE) IMPLANT
GOWN STRL REUS W/ TWL XL LVL3 (GOWN DISPOSABLE) ×1 IMPLANT
GOWN STRL REUS W/TWL 2XL LVL3 (GOWN DISPOSABLE) IMPLANT
GOWN STRL REUS W/TWL LRG LVL3 (GOWN DISPOSABLE) ×4
GOWN STRL REUS W/TWL XL LVL3 (GOWN DISPOSABLE) ×2
KIT BASIN OR (CUSTOM PROCEDURE TRAY) ×2 IMPLANT
KIT TURNOVER KIT B (KITS) ×2 IMPLANT
NDL HYPO 21X1.5 SAFETY (NEEDLE) IMPLANT
NEEDLE HYPO 21X1.5 SAFETY (NEEDLE) IMPLANT
NEEDLE HYPO 22GX1.5 SAFETY (NEEDLE) ×2 IMPLANT
NS IRRIG 1000ML POUR BTL (IV SOLUTION) ×2 IMPLANT
OIL CARTRIDGE MAESTRO DRILL (MISCELLANEOUS) ×2
PACK LAMINECTOMY NEURO (CUSTOM PROCEDURE TRAY) ×2 IMPLANT
PAD ARMBOARD 7.5X6 YLW CONV (MISCELLANEOUS) ×9 IMPLANT
PATTIES SURGICAL .5 X1 (DISPOSABLE) IMPLANT
SPONGE SURGIFOAM ABS GEL SZ50 (HEMOSTASIS) ×2 IMPLANT
STRIP CLOSURE SKIN 1/2X4 (GAUZE/BANDAGES/DRESSINGS) ×2 IMPLANT
SUT VIC AB 1 CT1 18XBRD ANBCTR (SUTURE) ×2 IMPLANT
SUT VIC AB 1 CT1 8-18 (SUTURE) ×4
SUT VIC AB 2-0 CP2 18 (SUTURE) ×4 IMPLANT
TOWEL GREEN STERILE (TOWEL DISPOSABLE) ×2 IMPLANT
TOWEL GREEN STERILE FF (TOWEL DISPOSABLE) ×2 IMPLANT
WATER STERILE IRR 1000ML POUR (IV SOLUTION) ×2 IMPLANT

## 2021-07-08 NOTE — Anesthesia Procedure Notes (Signed)
Procedure Name: Intubation ?Date/Time: 07/08/2021 9:28 AM ?Performed by: Erick Colace, CRNA ?Pre-anesthesia Checklist: Patient identified, Emergency Drugs available, Suction available and Patient being monitored ?Patient Re-evaluated:Patient Re-evaluated prior to induction ?Oxygen Delivery Method: Circle system utilized ?Preoxygenation: Pre-oxygenation with 100% oxygen ?Induction Type: IV induction ?Ventilation: Mask ventilation without difficulty ?Laryngoscope Size: Mac and 4 ?Grade View: Grade I ?Tube type: Oral ?Tube size: 7.5 mm ?Number of attempts: 1 ?Airway Equipment and Method: Stylet and Oral airway ?Placement Confirmation: ETT inserted through vocal cords under direct vision, positive ETCO2 and breath sounds checked- equal and bilateral ?Secured at: 22 cm ?Tube secured with: Tape ?Dental Injury: Teeth and Oropharynx as per pre-operative assessment  ? ? ? ? ?

## 2021-07-08 NOTE — H&P (Signed)
Subjective: ?The patient is an 88 year old white Hicks who has complained of back and right greater left leg pain consistent with neurogenic claudication.  He has failed medical management and was worked up with a lumbar MRI which demonstrated severe spinal stenosis at L3-4 and L4-5.  I discussed the various treatment options with him.  He has decided to proceed with surgery. ? ?Past Medical History:  ?Diagnosis Date  ? Arthritis   ? right and left hip  ? CHF (congestive heart failure) (HCC)   ? Heart murmur   ?  ?Past Surgical History:  ?Procedure Laterality Date  ? ARTERIAL BYPASS SURGRY  2001  ? right knee  ? MEDIAL PARTIAL KNEE REPLACEMENT  2009  ? TOTAL HIP ARTHROPLASTY  2001  ? left hip  ?  ?Allergies  ?Allergen Reactions  ? Oxycodone Other (See Comments)  ?  hallucinations ?  ?  ?Social History  ? ?Tobacco Use  ? Smoking status: Former  ? Smokeless tobacco: Not on file  ?Substance Use Topics  ? Alcohol use: No  ?  ?History reviewed. No pertinent family history. ?Prior to Admission medications   ?Medication Sig Start Date End Date Taking? Authorizing Provider  ?acetaminophen (TYLENOL) 325 MG tablet Take 975 mg by mouth every 6 (six) hours as needed for mild pain.   Yes [provider]  ?aspirin 325 MG EC tablet Take 162.5 mg by mouth daily.   Yes [provider]  ?Carboxymethylcellul-Glycerin (LUBRICATING EYE DROPS OP) Place 1 drop into both eyes daily as needed (dry eyes).   Yes [provider]  ?cholecalciferol (VITAMIN D3) 25 MCG (1000 UNIT) tablet Take 1,000 Units by mouth daily.   Yes [provider]  ?hydrocortisone 2.5 % cream Apply 1 application. topically 2 (two) times daily as needed (rash).   Yes [provider]  ?metoprolol (TOPROL-XL) 200 MG 24 hr tablet Take 100 mg by mouth daily.   Yes [provider]  ? ?  ?Review of Systems ? ?Positive ROS: As above ? ?All other systems have been reviewed and were otherwise negative with the exception of  those mentioned in the HPI and as above. ? ?Objective: ?Vital signs in last 24 hours: ?Temp:  [97.5 ?F (36.4 ?C)] 97.5 ?F (36.4 ?C) (05/03 9622) ?Pulse Rate:  [85] 85 (05/03 0659) ?Resp:  [18] 18 (05/03 0659) ?BP: (174)/(78) 174/78 (05/03 0659) ?SpO2:  [95 %] 95 % (05/03 0659) ?Weight:  [72.6 kg-74.8 kg] 74.8 kg (05/03 0659) ?Estimated body mass index is 26.63 kg/m? as calculated from the following: ?  Height as of this encounter: 5\' 6"  (1.676 m). ?  Weight as of this encounter: 74.8 kg. ? ? ?General Appearance: Alert ?Head: Normocephalic, without obvious abnormality, atraumatic ?Eyes: PERRL, conjunctiva/corneas clear, EOM's intact,    ?Ears: Normal  ?Throat: Normal  ?Neck: Supple, ?Back: unremarkable ?Lungs: Clear to auscultation bilaterally, respirations unlabored ?Heart: Regular rate and rhythm, no murmur, rub or gallop ?Abdomen: Soft, non-tender ?Extremities: Extremities normal, atraumatic, no cyanosis or edema ?Skin: unremarkable ? ?NEUROLOGIC:  ? ?Mental status: alert and oriented,Motor Exam - grossly normal ?Sensory Exam - grossly normal ?Reflexes:  ?Coordination - grossly normal ?Gait - grossly normal ?Balance - grossly normal ?Cranial Nerves: ?I: smell Not tested  ?II: visual acuity  OS: Normal  OD: Normal   ?II: visual fields Full to confrontation  ?II: pupils Equal, round, reactive to light  ?III,VII: ptosis None  ?III,IV,VI: extraocular muscles  Full ROM  ?V: mastication Normal  ?V: facial  light touch sensation  Normal  ?V,VII: corneal reflex  Present  ?VII: facial muscle function - upper  Normal  ?VII: facial muscle function - lower Normal  ?VIII: hearing Not tested  ?IX: soft palate elevation  Normal  ?IX,X: gag reflex Present  ?XI: trapezius strength  5/5  ?XI: sternocleidomastoid strength 5/5  ?XI: neck flexion strength  5/5  ?XII: tongue strength  Normal  ? ? ?Data Review ?Lab Results  ?Component Value Date  ? HGB 16.7 07/08/2021  ? HCT 49.0 07/08/2021  ? ?Lab Results  ?Component Value Date  ? NA  139 07/08/2021  ? K 4.8 07/08/2021  ? CL 105 07/08/2021  ? BUN 23 07/08/2021  ? CREATININE 0.80 07/08/2021  ? GLUCOSE 134 (H) 07/08/2021  ? ?No results found for: INR, PROTIME ? ?Assessment/Plan: ?Lumbar spinal stenosis, lumbar radiculopathy, lumbago, neurogenic claudication: I have discussed the situation with the patient.  I reviewed his imaging studies with him and pointed out the abnormalities.  We have discussed the various treatment options including surgery.  I have described the surgical treatment option of bilateral L3-4 and L4-5 laminotomy/foraminotomies.  I have shown him surgical models.  I have given him a surgical pamphlet.  We have discussed the risk, benefits, alternatives, expected postop course, and likelihood of achieving our goals with surgery.  I have answered all his questions.  He has decided to proceed with surgery. ? ? ?Cristi Loron ?07/08/2021 8:Douglas AM ? ?  ? ? ?

## 2021-07-08 NOTE — Transfer of Care (Signed)
Immediate Anesthesia Transfer of Care Note ? ?Patient: Douglas Hicks ? ?Procedure(s) Performed: BILATERAL Lumbar three-four, Lumbar four-five LAMINOTOMY,FORAMINOTOMY (Bilateral: Spine Lumbar) ? ?Patient Location: PACU ? ?Anesthesia Type:General ? ?Level of Consciousness: awake ? ?Airway & Oxygen Therapy: Patient Spontanous Breathing ? ?Post-op Assessment: Report given to RN and Post -op Vital signs reviewed and stable ? ?Post vital signs: Reviewed and stable ? ?Last Vitals:  ?Vitals Value Taken Time  ?BP 151/82 07/08/21 1153  ?Temp    ?Pulse 95 07/08/21 1153  ?Resp 12 07/08/21 1153  ?SpO2 94 % 07/08/21 1153  ?Vitals shown include unvalidated device data. ? ?Last Pain:  ?Vitals:  ? 07/08/21 0737  ?TempSrc:   ?PainSc: 0-No pain  ?   ? ?Patients Stated Pain Goal: 0 (07/08/21 0737) ? ?Complications: No notable events documented. ?

## 2021-07-08 NOTE — Anesthesia Procedure Notes (Signed)
Arterial Line Insertion ?Start/End5/05/2021 9:42 AM, 07/08/2021 9:48 AM ?Performed by: Beryle Lathe, MD, anesthesiologist ? Patient location: OR. ?Preanesthetic checklist: patient identified, IV checked, risks and benefits discussed, surgical consent, monitors and equipment checked, pre-op evaluation, timeout performed and anesthesia consent ?Patient sedated ?Right, radial was placed ?Catheter size: 20 G ?Hand hygiene performed  ? ?Attempts: 2 ?Procedure performed using ultrasound guided technique. ?Ultrasound Notes:anatomy identified, needle tip was noted to be adjacent to the nerve/plexus identified, no ultrasound evidence of intravascular and/or intraneural injection and image(s) printed for medical record ?Following insertion, dressing applied and Biopatch. ?Post procedure assessment: unchanged and normal ? ?Patient tolerated the procedure well with no immediate complications. ? ? ? ?

## 2021-07-08 NOTE — Progress Notes (Signed)
OR Nurse gave PACU Nurse Pt. Hearing Aid ?

## 2021-07-08 NOTE — TOC CM/SW Note (Signed)
Referral made from MD office to [facility].  ? ?Douglas Hicks will follow for home health orders.  ? ?DME will be provided by [facility] staff. ? ? ? ? ? ? ?No other  TOC needs have been identified at this time.  If new patient transition needs arise, please place a TOC consult. ?  ?

## 2021-07-08 NOTE — Anesthesia Postprocedure Evaluation (Signed)
Anesthesia Post Note ? ?Patient: Douglas Hicks ? ?Procedure(s) Performed: BILATERAL Lumbar three-four, Lumbar four-five LAMINOTOMY,FORAMINOTOMY (Bilateral: Spine Lumbar) ? ?  ? ?Patient location during evaluation: PACU ?Anesthesia Type: General ?Level of consciousness: awake and alert ?Pain management: pain level controlled ?Vital Signs Assessment: post-procedure vital signs reviewed and stable ?Respiratory status: spontaneous breathing, nonlabored ventilation and respiratory function stable ?Cardiovascular status: blood pressure returned to baseline ?Postop Assessment: no apparent nausea or vomiting ?Anesthetic complications: no ? ? ?No notable events documented. ? ?Last Vitals:  ?Vitals:  ? 07/08/21 1322 07/08/21 1355  ?BP: (!) 158/81 (!) 157/84  ?Pulse: 88 100  ?Resp: 10 18  ?Temp:    ?SpO2: 90% 96%  ?  ?Last Pain:  ?Vitals:  ? 07/08/21 1355  ?TempSrc:   ?PainSc: 3   ? ? ?  ?  ?  ?  ?  ?  ? ?Shanda Howells ? ? ? ? ?

## 2021-07-08 NOTE — Op Note (Signed)
Brief history: The patient is an 88 year old white male who has complained of back and bilateral leg pain consistent with neurogenic claudication.  He has failed medical management.  He was worked up with a lumbar MRI which demonstrated severe stenosis at L3-4 and L4-5.  I discussed the various treatment options with him.  He has decided proceed with surgery. ? ?Preoperative diagnosis: Lumbar spinal stenosis, neurogenic claudication, lumbago, lumbar radiculopathy ? ?Postoperative diagnosis: The same ? ?Procedure: Bilateral L3-4 and L4-5 laminotomy/foraminotomies using micro-dissection ? ?Surgeon: Dr. Delma Officer ? ?Asst.: Hildred Priest NP ? ?Anesthesia: Gen. endotracheal ? ?Estimated blood loss: Minimal ? ?Drains: None ? ?Complications: None ? ?Description of procedure: The patient was brought to the operating room by the anesthesia team. General endotracheal anesthesia was induced. The patient was turned to the prone position on the Wilson frame. The patient's lumbosacral region was then prepared with Betadine scrub and Betadine solution. Sterile drapes were applied. ? ?I then injected the area to be incised with Marcaine with epinephrine solution. I then used a scalpel to make a linear midline incision over the L3-4 and L4-5 intervertebral disc space. I then used electrocautery to perform a right sided subperiosteal dissection exposing the spinous process and lamina of L3, L4 and L5. We obtained intraoperative radiograph to confirm our location. I then inserted the [facility] retractor for exposure. ? ?We then brought the operative microscope into the field. Under its magnification and illumination we completed the microdissection. I used a high-speed drill to perform a laminotomy at L3-4 and L4-5 on the right. I then used a Kerrison punches to widen the laminotomy and removed the ligamentum flavum at L3-4 and L4-5 on the right. We then used microdissection to free up the thecal sac and the right L4 and L5 nerve  root from the epidural tissue. I then used a Kerrison punch to perform a foraminotomy at about the right L4 and L5 nerve root.  I used a high-speed drill to drill across the midline.  We then used a Kerrison punches to remove the left L3-4 and L4-5 ligamentum flavum and the lamina/facet.  We performed a foraminotomy about the left L4 and L5 nerve root.  We inspected the intervertebral disc at L3-4 and L4-5 bilaterally.  There were no herniations. ? ?I then palpated along the ventral surface of the thecal sac and along exit route of the bilateral L4 and L5 nerve root and noted that the neural structures were well decompressed. This completed the decompression. ? ?We then obtained hemostasis using bipolar electrocautery. We irrigated the wound out with bacitracin solution. We then removed the retractor. We then reapproximated the patient's thoracolumbar fascia with interrupted #1 Vicryl suture. We then reapproximated the patient's subcutaneous tissue with interrupted 2-0 Vicryl suture. We then reapproximated patient's skin with Steri-Strips and benzoin. The was then coated with bacitracin ointment. The drapes were removed. The patient was subsequently returned to the supine position where they were extubated by the anesthesia team. The patient was then transported to the postanesthesia care unit in stable condition. All sponge instrument and needle counts were reportedly correct at the end of this case. ?  ?

## 2021-07-09 ENCOUNTER — Encounter (HOSPITAL_COMMUNITY): Payer: Self-pay | Admitting: Neurosurgery

## 2021-07-09 DIAGNOSIS — M48062 Spinal stenosis, lumbar region with neurogenic claudication: Secondary | ICD-10-CM | POA: Diagnosis not present

## 2021-07-09 MED ORDER — HYDROCODONE-ACETAMINOPHEN 10-325 MG PO TABS: 1.0000 | ORAL_TABLET | ORAL | Status: DC | PRN

## 2021-07-09 MED ORDER — DOCUSATE SODIUM 100 MG PO CAPS: 100.0000 mg | ORAL_CAPSULE | Freq: Two times a day (BID) | ORAL | 0 refills | Status: AC

## 2021-07-09 MED ORDER — HYDROMORPHONE HCL 2 MG PO TABS: 1.0000 mg | ORAL_TABLET | ORAL | 0 refills | Status: DC | PRN

## 2021-07-09 NOTE — Discharge Instructions (Signed)
Wound Care °Keep the incision clean and dry remove the outer dressing in 2 days, leave the Steri-Strips intact.  °Do not put any creams, lotions, or ointments on incision. °Leave steri-strips on back.  They will fall off by themselves. ° °Activity °Walk each and every day, increasing distance each day. °No lifting greater than 5 lbs.  °No lifting no bending no twisting no driving or riding a car unless coming back and forth to see me. °If provided with back brace, wear when out of bed.  It is not necessary to wear brace in bed. ° °Diet °Resume your normal diet.  ° °Call Your Doctor If Any of These Occur °Redness, drainage, or swelling at the wound.  °Temperature greater than 101 degrees. °Severe pain not relieved by pain medication. °Incision starts to come apart. ° °Follow Up Appt °Call today for appointment in 1-2 weeks ([phone]) or for problems.  If you have any hardware placed in your spine, you will need an x-ray before your appointment. °  °

## 2021-07-09 NOTE — Discharge Summary (Signed)
Physician Discharge Summary  ?Patient ID: ?Douglas Hicks ?MRN: [id number] ?DOB/AGE: 1930-08-16 88 y.o. ? ?Admit date: 07/08/2021 ?Discharge date: 07/09/2021 ? ?Admission Diagnoses: Lumbago, lumbar spinal stenosis, lumbar radiculopathy, neurogenic claudication ? ?Discharge Diagnoses: The same ?Principal Problem: ?  Spinal stenosis of lumbar region with neurogenic claudication ? ? ?Discharged Condition: good ? ?Hospital Course: I performed bilateral L3-4 and L4-5 laminotomy/foraminotomies on the patient on 07/08/2021.  The surgery went well. ? ?The patient's postoperative course was unremarkable.  On postoperative day number #1 he requested discharge to home.  He was given verbal and written discharge instructions.  All his questions were answered. ? ?Consults: PT, OT, care management ?Significant Diagnostic Studies: None ?Treatments: Bilateral L3-4 and L4-5 laminotomy/foraminotomies ?Discharge Exam: ?Blood pressure 130/86, pulse 87, temperature 97.7 ?F (36.5 ?C), temperature source Oral, resp. rate 18, height 5\' 6"  (1.676 m), weight 74.8 kg, SpO2 94 %. ?The patient is alert and pleasant.  He looks well.  His dressing is clean and dry.  His strength is normal. ? ?Disposition: Home ? ?Discharge Instructions   ? ? Call MD for:  difficulty breathing, headache or visual disturbances   Complete by: As directed ?  ? Call MD for:  extreme fatigue   Complete by: As directed ?  ? Call MD for:  hives   Complete by: As directed ?  ? Call MD for:  persistant dizziness or light-headedness   Complete by: As directed ?  ? Call MD for:  persistant nausea and vomiting   Complete by: As directed ?  ? Call MD for:  redness, tenderness, or signs of infection (pain, swelling, redness, odor or green/yellow discharge around incision site)   Complete by: As directed ?  ? Call MD for:  severe uncontrolled pain   Complete by: As directed ?  ? Call MD for:  temperature >100.4   Complete by: As directed ?  ? Diet - low sodium heart healthy   Complete  by: As directed ?  ? Discharge instructions   Complete by: As directed ?  ? Call [phone] for a followup appointment. Take a stool softener while you are using pain medications.  ? Driving Restrictions   Complete by: As directed ?  ? Do not drive for 2 weeks.  ? Increase activity slowly   Complete by: As directed ?  ? Lifting restrictions   Complete by: As directed ?  ? Do not lift more than 5 pounds. No excessive bending or twisting.  ? May shower / Bathe   Complete by: As directed ?  ? Remove the dressing for 3 days after surgery.  You may shower, but leave the incision alone.  ? Remove dressing in 48 hours   Complete by: As directed ?  ? ?  ? ?Allergies as of 07/09/2021   ? ?   Reactions  ? Oxycodone Other (See Comments)  ? hallucinations  ? ?  ? ?  ?Medication List  ?  ? ?TAKE these medications   ? ?acetaminophen 325 MG tablet ?Commonly known as: TYLENOL ?Take 975 mg by mouth every 6 (six) hours as needed for mild pain. ?  ?aspirin 325 MG EC tablet ?Take 162.5 mg by mouth daily. ?  ?cholecalciferol 25 MCG (1000 UNIT) tablet ?Commonly known as: VITAMIN D3 ?Take 1,000 Units by mouth daily. ?  ?docusate sodium 100 MG capsule ?Commonly known as: COLACE ?Take 1 capsule (100 mg total) by mouth 2 (two) times daily. ?  ?hydrocortisone 2.5 % cream ?Apply  1 application. topically 2 (two) times daily as needed (rash). ?  ?HYDROmorphone 2 MG tablet ?Commonly known as: DILAUDID ?Take 0.5 tablets (1 mg total) by mouth every 4 (four) hours as needed for severe pain. ?  ?LUBRICATING EYE DROPS OP ?Place 1 drop into both eyes daily as needed (dry eyes). ?  ?metoprolol 200 MG 24 hr tablet ?Commonly known as: TOPROL-XL ?Take 100 mg by mouth daily. ?  ? ?  ? ? Follow-up Information   ? ? [facility] Follow up.   ?Contact information: ?[address] ?[place] [address] ?[phone] ? ? ?  ?  ? ?  ?  ? ?  ? ? ?Signed: ?Cristi Loron ?07/09/2021, 7:40 AM ? ? ? ?

## 2021-07-09 NOTE — Plan of Care (Signed)

## 2021-07-09 NOTE — Evaluation (Signed)
Physical Therapy Evaluation ?Patient Details ?Name: Douglas Hicks ?MRN: [id number] ?DOB: January 01, 1931 ?Today's Date: 07/09/2021 ? ?History of Present Illness ? Patient s/p bilateral L3-4 and L4-5 laminotomy/foraminotomies  ?Clinical Impression ? Patient admitted following above procedure. Educated patient extensively about back precautions and safety, patient reluctant to follow education. Patient at supervision level for mobility with RW. Patient will have wife and daughter present to provide necessary supervision. Recommend HHPT at discharge to reinforce precautions and maximize functional mobility and safety in the home. No further skilled PT needs identified acutely.  ?   ? ?Recommendations for follow up therapy are one component of a multi-disciplinary discharge planning process, led by the attending physician.  Recommendations may be updated based on patient status, additional functional criteria and insurance authorization. ? ?Follow Up Recommendations Home health PT ? ?  ?Assistance Recommended at Discharge Intermittent Supervision/Assistance  ?Patient can return home with the following ?   ? ?  ?Equipment Recommendations Rolling [name] (2 wheels);BSC/3in1  ?Recommendations for Other Services ?    ?  ?Functional Status Assessment Patient has had a recent decline in their functional status and demonstrates the ability to make significant improvements in function in a reasonable and predictable amount of time.  ? ?  ?Precautions / Restrictions Precautions ?Precautions: Back ?Precaution Booklet Issued: Yes (comment) ?Restrictions ?Weight Bearing Restrictions: No  ? ?  ? ?Mobility ? Bed Mobility ?  ?  ?  ?  ?  ?  ?  ?General bed mobility comments: in recliner on arrival ?  ? ?Transfers ?Overall transfer level: Needs assistance ?Equipment used: Rolling [name] (2 wheels) ?Transfers: Sit to/from Stand ?Sit to Stand: Supervision ?  ?  ?  ?  ?  ?  ?  ? ?Ambulation/Gait ?Ambulation/Gait assistance: Supervision ?Gait  Distance (Feet): 100 Feet ?Assistive device: Rolling [name] (2 wheels) ?Gait Pattern/deviations: Step-through pattern, Decreased stride length, Trunk flexed ?Gait velocity: decreased ?  ?  ?General Gait Details: supervision for safety. Cues for upright posture ? ?Stairs ?  ?  ?  ?  ?  ? ?Wheelchair Mobility ?  ? ?Modified Rankin (Stroke Patients Only) ?  ? ?  ? ?Balance Overall balance assessment: Needs assistance ?  ?Sitting balance-Leahy Scale: Fair ?  ?  ?Standing balance support: Bilateral upper extremity supported, Reliant on assistive device for balance ?Standing balance-Leahy Scale: Poor ?  ?  ?  ?  ?  ?  ?  ?  ?  ?  ?  ?  ?   ? ? ? ?Pertinent Vitals/Pain Pain Assessment ?Pain Assessment: No/denies pain  ? ? ?Home Living Family/patient expects to be discharged to:: Private residence ?Living Arrangements: Spouse/significant other ?Available Help at Discharge: Family;Available 24 hours/day ?Type of Home: House ?Home Access: Ramped entrance ?  ?  ?  ?Home Layout: One level ?Home Equipment: Adaptive equipment;Crutches;Cane - single point ?Additional Comments: uses a back scratcher more so to assist with LB dressing though he has professional AE  ?  ?Prior Function Prior Level of Function : Independent/Modified Independent ?  ?  ?  ?  ?  ?  ?  ?  ?  ? ? ?Hand Dominance  ?   ? ?  ?Extremity/Trunk Assessment  ? Upper Extremity Assessment ?Upper Extremity Assessment: Defer to OT evaluation ?  ? ?Lower Extremity Assessment ?Lower Extremity Assessment: Generalized weakness ?  ? ?Cervical / Trunk Assessment ?Cervical / Trunk Assessment: Back Surgery;Kyphotic  ?Communication  ? Communication: HOH  ?Cognition Arousal/Alertness: Awake/alert ?Behavior During Therapy:  WFL for tasks assessed/performed ?Overall Cognitive Status: Within Functional Limits for tasks assessed ?  ?  ?  ?  ?  ?  ?  ?  ?  ?  ?  ?  ?  ?  ?  ?  ?  ?  ?  ? ?  ?General Comments   ? ?  ?Exercises    ? ?Assessment/Plan  ?  ?PT Assessment Patient does not  need any further PT services  ?PT Problem List   ? ?   ?  ?PT Treatment Interventions     ? ?PT Goals (Current goals can be found in the Care Plan section)  ?Acute Rehab PT Goals ?Patient Stated Goal: to go home ?PT Goal Formulation: With patient/family ? ?  ?Frequency   ?  ? ? ?Co-evaluation   ?  ?  ?  ?  ? ? ?  ?AM-PAC PT "6 Clicks" Mobility  ?Outcome Measure Help needed turning from your back to your side while in a flat bed without using bedrails?: None ?Help needed moving from lying on your back to sitting on the side of a flat bed without using bedrails?: A Little ?Help needed moving to and from a bed to a chair (including a wheelchair)?: A Little ?Help needed standing up from a chair using your arms (e.g., wheelchair or bedside chair)?: A Little ?Help needed to walk in hospital room?: A Little ?Help needed climbing 3-5 steps with a railing? : A Little ?6 Click Score: 19 ? ?  ?End of Session   ?Activity Tolerance: Patient tolerated treatment well ?Patient left: in bed;with call bell/phone within reach ?Nurse Communication: Mobility status ?PT Visit Diagnosis: Muscle weakness (generalized) (M62.81) ?  ? ?Time: 8366-2947 ?PT Time Calculation (min) (ACUTE ONLY): 15 min ? ? ?Charges:   PT Evaluation ?$PT Eval Moderate Complexity: 1 Mod ?  ?  ?   ? ? ?Annelie Boak A. Dan Humphreys, PT, DPT ?Acute Rehabilitation Services ?Pager [phone] ?Office [phone] ? ? ?Shivaan Tierno A Jannelle Notaro ?07/09/2021, 10:44 AM ? ?

## 2021-07-09 NOTE — Progress Notes (Signed)
Patient awaiting to be transported to his vehicle for discharge home; in no acute distress nor complaints of pain nor discomfort; moves all extremities well; incision on his back with honeycomb dressing and is clean, dry and intact; room was checked for all his belongings; discharge instructions concerning his medications, incision care, follow up appointment and when to call the doctor as needed were all discussed with patient and his family by RN and all expressed understanding on the instructions given.  ?  ?  ?  ? ?

## 2021-07-09 NOTE — Evaluation (Signed)
Occupational Therapy Evaluation ?Patient Details ?Name: Douglas Hicks ?MRN: [id number] ?DOB: August 18, 1930 ?Today's Date: 07/09/2021 ? ? ?History of Present Illness Patient s/p bilateral L3-4 and L4-5 laminotomy/foraminotomies  ? ?Clinical Impression ?  ?Mr. Sharrieff Dachel is Patient is .... s/p back surgery who presents with spinal precautions and minimal complaints of pain. On evaluation patient exhibited difficulty with LB dressing and adhering to back precautions fully Patient educated on compensatory strategies for LB dressing and toileting. Patient demonstrated use of long handled shoe horn, reacher and sock aide to complete LB dressing though he typically uses a back scratcher to assist him at home. He does have sock aide, BSC, reacher and long handled shoe horn, shower chair and walker at home Patient at times inconsistent with following back precautions - sometimes leaning too far forward and resistant to the fact that he wasn't allowed stating "Sometimes you have to do it that way" but then stating "I know what you are telling me." Therapist recommends [facility] services at discharge to make sure is home environment is set up well and to reiterate spinal precautions consistently for best outcomes. ?   ? ?Recommendations for follow up therapy are one component of a multi-disciplinary discharge planning process, led by the attending physician.  Recommendations may be updated based on patient status, additional functional criteria and insurance authorization.  ? ?Follow Up Recommendations ? Home health OT  ?  ?Assistance Recommended at Discharge Intermittent Supervision/Assistance  ?Patient can return home with the following A little help with bathing/dressing/bathroom;Assistance with cooking/housework ? ?  ?Functional Status Assessment ? Patient has had a recent decline in their functional status and demonstrates the ability to make significant improvements in function in a reasonable and predictable amount of time.   ?Equipment Recommendations ? None recommended by OT  ?  ?Recommendations for Other Services   ? ? ?  ?Precautions / Restrictions Precautions ?Precautions: Back ?Precaution Booklet Issued: Yes (comment) ?Restrictions ?Weight Bearing Restrictions: No  ? ?  ? ?Mobility Bed Mobility ?  ?  ?  ?  ?  ?  ?  ?  ?  ? ?Transfers ?  ?  ?  ?  ?  ?  ?  ?  ?  ?  ?  ? ?  ?Balance Overall balance assessment: Mild deficits observed, not formally tested ?  ?  ?  ?  ?  ?  ?  ?  ?  ?  ?  ?  ?  ?  ?  ?  ?  ?  ?   ? ?ADL either performed or assessed with clinical judgement  ? ?ADL Overall ADL's : Needs assistance/impaired ?Eating/Feeding: Independent ?  ?Grooming: Supervision/safety;Standing ?  ?Upper Body Bathing: Independent ?  ?Lower Body Bathing: Minimal assistance;Adhering to back precautions ?  ?Upper Body Dressing : Independent ?  ?Lower Body Dressing: Minimal assistance;Adhering to back precautions ?Lower Body Dressing Details (indicate cue type and reason): use of sock aide and reacher - still needed assistance to get sock on. Requested he ask assistance from spouse before reaching down to his feet ?Toilet Transfer: Supervision/safety;Rolling walker (2 wheels);Regular Toilet;Grab bars ?Toilet Transfer Details (indicate cue type and reason): verbal cues for back precaution ?Toileting- Clothing Manipulation and Hygiene: Supervision/safety;Sitting/lateral lean ?Toileting - Clothing Manipulation Details (indicate cue type and reason): verbal cues for back precautions ?  ?  ?Functional mobility during ADLs: Min guard;Rolling walker (2 wheels) ?   ? ? ? ?Vision Patient Visual Report: No change from baseline ?   ?   ?  Perception   ?  ?Praxis   ?  ? ?Pertinent Vitals/Pain Pain Assessment ?Pain Assessment: No/denies pain  ? ? ? ?Hand Dominance   ?  ?Extremity/Trunk Assessment Upper Extremity Assessment ?Upper Extremity Assessment: Overall WFL for tasks assessed ?  ?Lower Extremity Assessment ?Lower Extremity Assessment: Defer to PT  evaluation ?  ?Cervical / Trunk Assessment ?Cervical / Trunk Assessment: Kyphotic ?  ?Communication Communication ?Communication: HOH ?  ?Cognition Arousal/Alertness: Awake/alert ?Behavior During Therapy: [facility] for tasks assessed/performed ?Overall Cognitive Status: Within Functional Limits for tasks assessed ?  ?  ?  ?  ?  ?  ?  ?  ?  ?  ?  ?  ?  ?  ?  ?  ?  ?  ?  ?General Comments    ? ?  ?Exercises   ?  ?Shoulder Instructions    ? ? ?Home Living Family/patient expects to be discharged to:: Private residence ?Living Arrangements: Spouse/significant other ?Available Help at Discharge: Family;Available 24 hours/day ?Type of Home: House ?Home Access: Ramped entrance ?  ?  ?Home Layout: One level ?  ?  ?Bathroom Shower/Tub: Tub/shower unit ?  ?Bathroom Toilet: Standard ?  ?  ?Home Equipment: BSC/3in1;Rolling Walker (2 wheels);Adaptive equipment;Crutches;Cane - single point ?Adaptive Equipment: Reacher;Sock aid;Long-handled shoe horn ?Additional Comments: uses a back scratcher more so to assist with LB dressing though he has professional AE ?  ? ?  ?Prior Functioning/Environment Prior Level of Function : Independent/Modified Independent ?  ?  ?  ?  ?  ?  ?  ?  ?  ? ?  ?  ?OT Problem List: Decreased knowledge of use of DME or AE;Decreased knowledge of precautions;Pain ?  ?   ?OT Treatment/Interventions:    ?  ?OT Goals(Current goals can be found in the care plan section) Acute Rehab OT Goals ?OT Goal Formulation: All assessment and education complete, DC therapy  ?OT Frequency:   ?  ? ?Co-evaluation   ?  ?  ?  ?  ? ?  ?AM-PAC OT "6 Clicks" Daily Activity     ?Outcome Measure Help from another person eating meals?: None ?Help from another person taking care of personal grooming?: A Little ?Help from another person toileting, which includes using toliet, bedpan, or urinal?: A Little ?Help from another person bathing (including washing, rinsing, drying)?: A Little ?Help from another person to put on and taking off regular  upper body clothing?: None ?Help from another person to put on and taking off regular lower body clothing?: A Little ?6 Click Score: 20 ?  ?End of Session Equipment Utilized During Treatment: Rolling walker (2 wheels);Other (comment) (AE) ?Nurse Communication: Mobility status ? ?Activity Tolerance: Patient tolerated treatment well ?Patient left: in chair;with call bell/phone within reach ? ?OT Visit Diagnosis: Pain  ?              ?Time: [id number] ?OT Time Calculation (min): 31 min ?Charges:  OT General Charges ?$OT Visit: 1 Visit ?OT Evaluation ?$OT Eval Low Complexity: 1 Low ?OT Treatments ?$Self Care/Home Management : 8-22 mins ? ?Jasiri Hanawalt, OTR/L ?Acute Care Rehab Services  ?Office [phone] ?Pager: [phone]  ? ?Dorna Mallet L Shirleymae Hauth ?07/09/2021, 9:14 AM ?

## 2021-10-15 ENCOUNTER — Other Ambulatory Visit: Payer: Self-pay

## 2021-10-15 ENCOUNTER — Emergency Department (HOSPITAL_COMMUNITY): Payer: No Typology Code available for payment source

## 2021-10-15 ENCOUNTER — Inpatient Hospital Stay (HOSPITAL_COMMUNITY)
Admission: EM | Admit: 2021-10-15 | Discharge: 2021-10-17 | DRG: 291 | Disposition: A | Discharge status: Home / Self Care | Payer: No Typology Code available for payment source | Attending: Internal Medicine | Admitting: Internal Medicine

## 2021-10-15 DIAGNOSIS — Z7982 Long term (current) use of aspirin: Secondary | ICD-10-CM

## 2021-10-15 DIAGNOSIS — K5730 Diverticulosis of large intestine without perforation or abscess without bleeding: Secondary | ICD-10-CM

## 2021-10-15 DIAGNOSIS — I10 Essential (primary) hypertension: Secondary | ICD-10-CM

## 2021-10-15 DIAGNOSIS — H9190 Unspecified hearing loss, unspecified ear: Secondary | ICD-10-CM | POA: Diagnosis present

## 2021-10-15 DIAGNOSIS — Z79899 Other long term (current) drug therapy: Secondary | ICD-10-CM

## 2021-10-15 DIAGNOSIS — Z888 Allergy status to other drugs, medicaments and biological substances status: Secondary | ICD-10-CM | POA: Diagnosis not present

## 2021-10-15 DIAGNOSIS — I5023 Acute on chronic systolic (congestive) heart failure: Secondary | ICD-10-CM | POA: Diagnosis present

## 2021-10-15 DIAGNOSIS — Z96642 Presence of left artificial hip joint: Secondary | ICD-10-CM | POA: Diagnosis present

## 2021-10-15 DIAGNOSIS — I739 Peripheral vascular disease, unspecified: Secondary | ICD-10-CM | POA: Diagnosis present

## 2021-10-15 DIAGNOSIS — Z86718 Personal history of other venous thrombosis and embolism: Secondary | ICD-10-CM

## 2021-10-15 DIAGNOSIS — E785 Hyperlipidemia, unspecified: Secondary | ICD-10-CM | POA: Diagnosis present

## 2021-10-15 DIAGNOSIS — Z951 Presence of aortocoronary bypass graft: Secondary | ICD-10-CM

## 2021-10-15 DIAGNOSIS — Z7901 Long term (current) use of anticoagulants: Secondary | ICD-10-CM | POA: Diagnosis not present

## 2021-10-15 DIAGNOSIS — I255 Ischemic cardiomyopathy: Secondary | ICD-10-CM | POA: Diagnosis present

## 2021-10-15 DIAGNOSIS — I110 Hypertensive heart disease with heart failure: Principal | ICD-10-CM | POA: Diagnosis present

## 2021-10-15 DIAGNOSIS — I4891 Unspecified atrial fibrillation: Principal | ICD-10-CM | POA: Diagnosis present

## 2021-10-15 DIAGNOSIS — I4821 Permanent atrial fibrillation: Secondary | ICD-10-CM | POA: Diagnosis present

## 2021-10-15 DIAGNOSIS — I482 Chronic atrial fibrillation, unspecified: Secondary | ICD-10-CM | POA: Diagnosis not present

## 2021-10-15 DIAGNOSIS — Z885 Allergy status to narcotic agent status: Secondary | ICD-10-CM

## 2021-10-15 DIAGNOSIS — I340 Nonrheumatic mitral (valve) insufficiency: Secondary | ICD-10-CM | POA: Diagnosis present

## 2021-10-15 DIAGNOSIS — R000 Tachycardia, unspecified: Secondary | ICD-10-CM | POA: Diagnosis present

## 2021-10-15 DIAGNOSIS — I517 Cardiomegaly: Secondary | ICD-10-CM | POA: Diagnosis not present

## 2021-10-15 DIAGNOSIS — I2510 Atherosclerotic heart disease of native coronary artery without angina pectoris: Secondary | ICD-10-CM | POA: Diagnosis present

## 2021-10-15 DIAGNOSIS — I5021 Acute systolic (congestive) heart failure: Secondary | ICD-10-CM | POA: Diagnosis not present

## 2021-10-15 DIAGNOSIS — I700 Atherosclerosis of aorta: Secondary | ICD-10-CM

## 2021-10-15 DIAGNOSIS — I5020 Unspecified systolic (congestive) heart failure: Secondary | ICD-10-CM | POA: Diagnosis not present

## 2021-10-15 DIAGNOSIS — I447 Left bundle-branch block, unspecified: Secondary | ICD-10-CM | POA: Diagnosis present

## 2021-10-15 DIAGNOSIS — F419 Anxiety disorder, unspecified: Secondary | ICD-10-CM | POA: Diagnosis present

## 2021-10-15 DIAGNOSIS — I509 Heart failure, unspecified: Secondary | ICD-10-CM

## 2021-10-15 DIAGNOSIS — Z86711 Personal history of pulmonary embolism: Secondary | ICD-10-CM

## 2021-10-15 LAB — TROPONIN I (HIGH SENSITIVITY)
Troponin I (High Sensitivity): 11 ng/L (ref <18)
Troponin I (High Sensitivity): 13 ng/L (ref <18)

## 2021-10-15 LAB — COMPREHENSIVE METABOLIC PANEL
ALT: 13 U/L (ref 0–44)
AST: 18 U/L (ref 15–41)
Albumin: 3.1 g/dL — ABNORMAL LOW (ref 3.5–5.0)
Alkaline Phosphatase: 61 U/L (ref 38–126)
Anion gap: 9 (ref 5–15)
BUN: 17 mg/dL (ref 8–23)
CO2: 24 mmol/L (ref 22–32)
Calcium: 8.6 mg/dL — ABNORMAL LOW (ref 8.9–10.3)
Chloride: 104 mmol/L (ref 98–111)
Creatinine, Ser: 1.11 mg/dL (ref 0.61–1.24)
GFR, Estimated: >60 mL/min (ref >60)
Glucose, Bld: 131 mg/dL — ABNORMAL HIGH (ref 70–99)
Potassium: 4.6 mmol/L (ref 3.5–5.1)
Sodium: 137 mmol/L (ref 135–145)
Total Bilirubin: 1.5 mg/dL — ABNORMAL HIGH (ref 0.3–1.2)
Total Protein: 6.8 g/dL (ref 6.5–8.1)

## 2021-10-15 LAB — URINALYSIS, ROUTINE W REFLEX MICROSCOPIC
Bacteria, UA: NONE SEEN
Bilirubin Urine: NEGATIVE
Glucose, UA: NEGATIVE mg/dL
Hgb urine dipstick: NEGATIVE
Ketones, ur: 5 mg/dL — AB
Leukocytes,Ua: NEGATIVE
Nitrite: NEGATIVE
Protein, ur: 30 mg/dL — AB
Specific Gravity, Urine: 1.019 (ref 1.005–1.030)
pH: 8 (ref 5.0–8.0)

## 2021-10-15 LAB — CBC
HCT: 41.3 % (ref 39.0–52.0)
Hemoglobin: 13.2 g/dL (ref 13.0–17.0)
MCH: 29.7 pg (ref 26.0–34.0)
MCHC: 32 g/dL (ref 30.0–36.0)
MCV: 92.8 fL (ref 80.0–100.0)
Platelets: 261 10*3/uL (ref 150–400)
RBC: 4.45 MIL/uL (ref 4.22–5.81)
RDW: 13.4 % (ref 11.5–15.5)
WBC: 8.9 10*3/uL (ref 4.0–10.5)
nRBC: 0 % (ref 0.0–0.2)

## 2021-10-15 LAB — BRAIN NATRIURETIC PEPTIDE: B Natriuretic Peptide: 579 pg/mL — ABNORMAL HIGH (ref 0.0–100.0)

## 2021-10-15 LAB — LIPASE, BLOOD: Lipase: 23 U/L (ref 11–51)

## 2021-10-15 MED ORDER — SODIUM CHLORIDE 0.9 % IV SOLN: 250.0000 mL | INTRAVENOUS | Status: DC | PRN

## 2021-10-15 MED ORDER — IOHEXOL 350 MG/ML SOLN
100.0000 mL | Freq: Once | INTRAVENOUS | Status: AC | PRN
  Administered 2021-10-15: 100 mL via INTRAVENOUS

## 2021-10-15 MED ORDER — LORAZEPAM 2 MG/ML IJ SOLN
0.5000 mg | Freq: Once | INTRAMUSCULAR | Status: AC
  Administered 2021-10-15: 0.5 mg via INTRAVENOUS
  Filled 2021-10-15: qty 1

## 2021-10-15 MED ORDER — HYDROCODONE-ACETAMINOPHEN 5-325 MG PO TABS
1.0000 | ORAL_TABLET | Freq: Once | ORAL | Status: AC
  Administered 2021-10-15: 1 via ORAL
  Filled 2021-10-15: qty 1

## 2021-10-15 MED ORDER — METOPROLOL SUCCINATE ER 25 MG PO TB24
100.0000 mg | ORAL_TABLET | Freq: Every day | ORAL | Status: DC
  Administered 2021-10-15: 100 mg via ORAL
  Filled 2021-10-15: qty 4

## 2021-10-15 MED ORDER — ASPIRIN 81 MG PO TBEC: 162.5000 mg | DELAYED_RELEASE_TABLET | Freq: Every day | ORAL | Status: DC

## 2021-10-15 MED ORDER — SODIUM CHLORIDE 0.9% FLUSH
3.0000 mL | Freq: Two times a day (BID) | INTRAVENOUS | Status: DC
  Administered 2021-10-16 – 2021-10-17 (×3): 3 mL via INTRAVENOUS

## 2021-10-15 MED ORDER — ASPIRIN 81 MG PO CHEW: 81.0000 mg | CHEWABLE_TABLET | Freq: Every day | ORAL | Status: DC

## 2021-10-15 MED ORDER — METOPROLOL TARTRATE 5 MG/5ML IV SOLN
5.0000 mg | Freq: Once | INTRAVENOUS | Status: AC
  Administered 2021-10-15: 5 mg via INTRAVENOUS
  Filled 2021-10-15: qty 5

## 2021-10-15 MED ORDER — ASPIRIN 325 MG PO TABS
325.0000 mg | ORAL_TABLET | Freq: Every day | ORAL | Status: DC
  Administered 2021-10-15 – 2021-10-16 (×2): 325 mg via ORAL
  Filled 2021-10-15: qty 1

## 2021-10-15 MED ORDER — ONDANSETRON HCL 4 MG/2ML IJ SOLN: 4.0000 mg | Freq: Four times a day (QID) | INTRAMUSCULAR | Status: DC | PRN

## 2021-10-15 MED ORDER — ACETAMINOPHEN 325 MG PO TABS: 650.0000 mg | ORAL_TABLET | ORAL | Status: DC | PRN

## 2021-10-15 MED ORDER — METOPROLOL TARTRATE 12.5 MG HALF TABLET: 12.5000 mg | ORAL_TABLET | Freq: Four times a day (QID) | ORAL | Status: DC | PRN

## 2021-10-15 MED ORDER — RIVAROXABAN 10 MG PO TABS: 20.0000 mg | ORAL_TABLET | Freq: Every day | ORAL | Status: DC

## 2021-10-15 MED ORDER — DILTIAZEM HCL-DEXTROSE 125-5 MG/125ML-% IV SOLN (PREMIX)
5.0000 mg/h | INTRAVENOUS | Status: DC
  Administered 2021-10-15: 5 mg/h via INTRAVENOUS
  Filled 2021-10-15: qty 125

## 2021-10-15 MED ORDER — ASPIRIN 325 MG PO TABS
325.0000 mg | ORAL_TABLET | Freq: Every day | ORAL | Status: DC
  Filled 2021-10-15: qty 1

## 2021-10-15 MED ORDER — AMIODARONE LOAD VIA INFUSION
150.0000 mg | Freq: Once | INTRAVENOUS | Status: AC
  Administered 2021-10-15: 150 mg via INTRAVENOUS
  Filled 2021-10-15: qty 83.34

## 2021-10-15 MED ORDER — FUROSEMIDE 10 MG/ML IJ SOLN
40.0000 mg | Freq: Once | INTRAMUSCULAR | Status: AC
  Administered 2021-10-15: 40 mg via INTRAVENOUS
  Filled 2021-10-15: qty 4

## 2021-10-15 MED ORDER — SODIUM CHLORIDE 0.9% FLUSH: 3.0000 mL | INTRAVENOUS | Status: DC | PRN

## 2021-10-15 MED ORDER — AMIODARONE HCL IN DEXTROSE 360-4.14 MG/200ML-% IV SOLN
60.0000 mg/h | INTRAVENOUS | Status: AC
  Administered 2021-10-15 – 2021-10-16 (×2): 60 mg/h via INTRAVENOUS
  Filled 2021-10-15: qty 200

## 2021-10-15 MED ORDER — AMIODARONE HCL IN DEXTROSE 360-4.14 MG/200ML-% IV SOLN
30.0000 mg/h | INTRAVENOUS | Status: DC
  Administered 2021-10-16 – 2021-10-17 (×4): 30 mg/h via INTRAVENOUS
  Filled 2021-10-15 (×3): qty 200

## 2021-10-15 MED ORDER — DILTIAZEM LOAD VIA INFUSION
10.0000 mg | Freq: Once | INTRAVENOUS | Status: AC
  Administered 2021-10-15: 10 mg via INTRAVENOUS
  Filled 2021-10-15: qty 10

## 2021-10-15 MED ORDER — FUROSEMIDE 20 MG PO TABS: 20.0000 mg | ORAL_TABLET | Freq: Every day | ORAL | 0 refills | Status: DC

## 2021-10-15 MED ORDER — PHENAZOPYRIDINE HCL 100 MG PO TABS
200.0000 mg | ORAL_TABLET | Freq: Once | ORAL | Status: AC
  Administered 2021-10-15: 200 mg via ORAL
  Filled 2021-10-15: qty 2

## 2021-10-15 NOTE — ED Notes (Signed)
Cardiology Dr.T at bedside

## 2021-10-15 NOTE — ED Triage Notes (Signed)
Pt reports abdominal "fullness"  and constant nausea, no vomiting. Was seen and had CT at the [place] 2 days ago and reports "they have got to be missing something"  " I am so full"  and "I'm scared to eat anything" " I wish I could throw up"  No vomiting, chills, CP, or shob.

## 2021-10-15 NOTE — ED Notes (Signed)
Pt's daughter Scheryl Marten can be reached at [phone]

## 2021-10-15 NOTE — H&P (Addendum)
Cardiology Admission History and Physical:   Patient ID: BEACHER EVERY MRN: [id number]; DOB: Sep 01, 1930   Admission date: 10/15/2021  PCP:  [facility]   [facility] Providers Cardiologist:  None        Chief Complaint:  SOB, abdominal pain  Patient Profile:   Douglas Hicks is a 88 y.o. male with CAD s/p CABGx4 (2009), HFrEF (EF 20-25% in 2019) and HTN who is being seen 10/15/2021 for the evaluation of decompensated HF.  History of Present Illness:   Douglas Hicks is an 88 year old male with coronary artery disease status post four-vessel CABG in 2009, heart failure with reduced ejection fraction (EF of 20 to 25% in 2019), and hypertension who presents to the ER for shortness of breath while lying down and abdominal pain.  Patient reports he developed abdominal pain a few days ago.  He thought abdominal pain was secondary to constipation so he took a laxative without relief.  He has also noted shortness of breath while lying flat.  He has a known history of heart failure with reduced ejection fraction, but has fallen out of care with [facility] due to COVID.  His last echocardiogram in 2019 showed an EF of 20%.  His only guideline directed medical therapy is metoprolol 100 daily.  He reports that he may have skipped a few medications.  His daughter at bedside also reports he does not take his medications consistently.  Patient also has a known history of chronic atrial fibrillation for the last 20 years.  Per chart review he has been prescribed Xarelto and apixaban in the past, but it is unclear if he is taking this medication consistently.  Prior charting reports that he stopped Eliquis because he did not feel like taking it.  He is on rate control with metoprolol 100 daily, but is also not taking this consistently.  In the emergency department patient was noted to have heart rates in the 130s.  Otherwise he was afebrile and hemodynamically stable.  He is on 1 L  of oxygen and saturating 93%.  His EKG shows atrial fibrillation.  He was given IV Lasix 40 and started on a diltiazem drip for his elevated heart rate.  CT PE was negative for PE but showed interstitial edema.  CBC, CMP, UA were all unremarkable.  CT abdomen pelvis showed sigmoid diverticulosis without active diverticulitis and did not show acute findings in the abdomen or pelvis. He was given vicodin and IV lorazepam for lower abdominal pain and anxiety by the ER.  Past Medical History:  Diagnosis Date   Arthritis    right and left hip   CHF (congestive heart failure) (HCC)    Heart murmur     Past Surgical History:  Procedure Laterality Date   ARTERIAL BYPASS SURGRY  2001   right knee   LUMBAR LAMINECTOMY/DECOMPRESSION MICRODISCECTOMY Bilateral 07/08/2021   Procedure: BILATERAL Lumbar three-four, Lumbar four-five LAMINOTOMY,FORAMINOTOMY;  Surgeon: Tressie Stalker, MD;  Location: [facility] OR;  Service: Neurosurgery;  Laterality: Bilateral;   MEDIAL PARTIAL KNEE REPLACEMENT  2009   TOTAL HIP ARTHROPLASTY  2001   left hip     Medications Prior to Admission: Prior to Admission medications   Medication Sig Start Date End Date Taking? Authorizing Provider  furosemide (LASIX) 20 MG tablet Take 1 tablet (20 mg total) by mouth daily. 10/15/21  Yes Sherian Maroon A, PA  acetaminophen (TYLENOL) 325 MG tablet Take 975 mg by mouth every 6 (six) hours as needed  for mild pain.    [provider]  aspirin 325 MG EC tablet Take 162.5 mg by mouth daily.    [provider]  Carboxymethylcellul-Glycerin (LUBRICATING EYE DROPS OP) Place 1 drop into both eyes daily as needed (dry eyes).    [provider]  cholecalciferol (VITAMIN D3) 25 MCG (1000 UNIT) tablet Take 1,000 Units by mouth daily.    [provider]  docusate sodium (COLACE) 100 MG capsule Take 1 capsule (100 mg total) by mouth 2 (two) times daily. 07/09/21   Newman Pies, MD  hydrocortisone 2.5 % cream Apply 1  application. topically 2 (two) times daily as needed (rash).    [provider]  HYDROmorphone (DILAUDID) 2 MG tablet Take 0.5 tablets (1 mg total) by mouth every 4 (four) hours as needed for severe pain. 07/09/21   Newman Pies, MD  metoprolol (TOPROL-XL) 200 MG 24 hr tablet Take 100 mg by mouth daily.    [provider]     Allergies:    Allergies  Allergen Reactions   Oxycodone Other (See Comments)    hallucinations     Social History:   Social History   Socioeconomic History   Marital status: Single    Spouse name: Not on file   Number of children: Not on file   Years of education: Not on file   Highest education level: Not on file  Occupational History   Occupation: retired  Tobacco Use   Smoking status: Former   Smokeless tobacco: Not on Landscape architect Use: Never used  Substance and Sexual Activity   Alcohol use: No   Drug use: No   Sexual activity: Not Currently  Other Topics Concern   Not on file  Social History Narrative   Married 83 years   [place]   Reitred [employer] but doesn't like [place] for med care   Social Determinants of Health   Financial Resource Strain: Not on file  Food Insecurity: Not on file  Transportation Needs: Not on file  Physical Activity: Not on file  Stress: Not on file  Social Connections: Not on file  Intimate Partner Violence: Not on file    Family History:   No family history of cardiomyopathy or early ischemic heart disease  ROS:  Please see the history of present illness.  All other ROS reviewed and negative.     Physical Exam/Data:   Vitals:   10/15/21 1930 10/15/21 1945 10/15/21 2000 10/15/21 2015  BP: (!) 147/109 (!) 170/100 (!) 176/111 (!) 161/121  Pulse: (!) 146 (!) 128 (!) 139 (!) 122  Resp: (!) 32 (!) 31 (!) 32 15  Temp:      TempSrc:      SpO2: 94% 94% 93% 93%  Weight:      Height:       No intake or output data in the 24 hours ending 10/15/21 2130    10/15/2021     4:26 PM 07/08/2021    6:59 AM 07/07/2021   10:56 AM  Last 3 Weights  Weight (lbs) 160 lb 165 lb 160 lb  Weight (kg) 72.576 kg 74.844 kg 72.576 kg     Body mass index is 25.82 kg/m.  General:  elderly caucasian Male in NAD HEENT: normal Neck: elevated JVD Vascular: No carotid bruits; Distal pulses 2+ bilaterally   Cardiac:  irregularly irregular rhythm Lungs:  b/l crackles Abd: soft, no hepatomegaly  Ext: 1+ b/l pitting edema Musculoskeletal:  No deformities, BUE and BLE strength normal and equal Skin: warm and dry  Neuro:  CNs 2-12 intact, no focal abnormalities noted Psych:  Normal affect   EKG:  The ECG shows atrial fibrillation. No ST/T changes c/f ischemia  Relevant CV Studies: ECHO 2019 TECHNICALLY LIMITED STUDY  SEVERE GLOBAL LV DYSFUNCTION  EF 20-25%  AORTIC VALVE SCLEROSIS NO STENOSIS  RV AND FUNCTION NORMAL  DILATED IVC  NO PERICARDIAL EFFUSION  MILD MR  MILD TR  COMPARED TO PRIOR EF A LITTLE LESS.   Laboratory Data:  High Sensitivity Troponin:   Recent Labs  Lab 10/15/21 1600 10/15/21 1836  TROPONINIHS 11 13      Chemistry Recent Labs  Lab 10/15/21 0846  NA 137  K 4.6  CL 104  CO2 24  GLUCOSE 131*  BUN 17  CREATININE 1.11  CALCIUM 8.6*  GFRNONAA >60  ANIONGAP 9    Recent Labs  Lab 10/15/21 0846  PROT 6.8  ALBUMIN 3.1*  AST 18  ALT 13  ALKPHOS 61  BILITOT 1.5*   Lipids No results for input(s): "CHOL", "TRIG", "HDL", "LABVLDL", "LDLCALC", "CHOLHDL" in the last 168 hours. Hematology Recent Labs  Lab 10/15/21 0846  WBC 8.9  RBC 4.45  HGB 13.2  HCT 41.3  MCV 92.8  MCH 29.7  MCHC 32.0  RDW 13.4  PLT 261   Thyroid No results for input(s): "TSH", "FREET4" in the last 168 hours. BNP Recent Labs  Lab 10/15/21 1600  BNP 579.0*    DDimer No results for input(s): "DDIMER" in the last 168 hours.   Radiology/Studies:  CT Abdomen Pelvis W Contrast  Result Date: 10/15/2021 CLINICAL DATA:  Upper abdominal pain, nausea EXAM: CT  ABDOMEN AND PELVIS WITH CONTRAST TECHNIQUE: Multidetector CT imaging of the abdomen and pelvis was performed using the standard protocol following bolus administration of intravenous contrast. RADIATION DOSE REDUCTION: This exam was performed according to the departmental dose-optimization program which includes automated exposure control, adjustment of the mA and/or kV according to patient size and/or use of iterative reconstruction technique. CONTRAST:  OMNIPAQUE IOHEXOL 350 MG/ML SOLN COMPARISON:  None Available. FINDINGS: Lower chest: See chest CT report Hepatobiliary: No focal hepatic abnormality. Gallbladder unremarkable. Pancreas: No focal abnormality or ductal dilatation. Fatty replacement. Spleen: No focal abnormality.  Normal size. Adrenals/Urinary Tract: 5.7 cm exophytic cyst off the lower pole of the right kidney. This appears benign. No follow-up imaging recommended. No renal or ureteral stones. No hydronephrosis. Adrenal glands and urinary bladder unremarkable. Stomach/Bowel: Sigmoid diverticulosis. No active diverticulitis. Stomach and small bowel decompressed, unremarkable. Vascular/Lymphatic: Aortic atherosclerosis. No evidence of aneurysm or adenopathy. Reproductive: Obscured by beam hardening artifact from bilateral hip replacements. Other: No free fluid or free air. Musculoskeletal: Bilateral replacements. Degenerative changes in the lumbar spine. No acute bony abnormality. IMPRESSION: Sigmoid diverticulosis.  No active diverticulitis. Aortic atherosclerosis. No acute findings in the abdomen or pelvis. Electronically Signed   By: Charlett Nose M.D.   On: 10/15/2021 17:20   CT Angio Chest PE W/Cm &/Or Wo Cm  Result Date: 10/15/2021 CLINICAL DATA:  Pulmonary embolism (PE) suspected, high prob. Upper abdominal pain EXAM: CT ANGIOGRAPHY CHEST WITH CONTRAST TECHNIQUE: Multidetector CT imaging of the chest was performed using the standard protocol during bolus administration of intravenous  contrast. Multiplanar CT image reconstructions and MIPs were obtained to evaluate the vascular anatomy. RADIATION DOSE REDUCTION: This exam was performed according to the departmental dose-optimization program which includes automated exposure control, adjustment of the mA and/or  kV according to patient size and/or use of iterative reconstruction technique. CONTRAST:  142mL OMNIPAQUE IOHEXOL 350 MG/ML SOLN COMPARISON:  None Available. FINDINGS: Cardiovascular: No filling defects in the pulmonary arteries to suggest pulmonary emboli. Cardiomegaly. Prior CABG. Aortic atherosclerosis. No aneurysm. Mediastinum/Nodes: Mildly prominent mediastinal lymph nodes. AP window lymph node has a short axis diameter of 12 mm. Similarly sized prevascular and subcarinal lymph nodes. Lungs/Pleura: Diffuse ground-glass opacities throughout the lungs may reflect mild edema. Interstitial thickening in the lung bases. No effusions. Upper Abdomen: No acute findings Musculoskeletal: Chest wall soft tissues are unremarkable. No acute bony abnormality. Review of the MIP images confirms the above findings. IMPRESSION: No evidence of pulmonary embolus. Cardiomegaly. Interstitial prominence and ground-glass opacities in the lungs or reflect mild interstitial edema. Borderline mediastinal lymph nodes, likely reactive. Aortic Atherosclerosis (ICD10-I70.0).  Prior CABG Electronically Signed   By: Rolm Baptise M.D.   On: 10/15/2021 17:17     Assessment and Plan:   #. Acute decompensated systolic heart failure with reduced ejection fraction (EF 20-25%, 2019) - Presents with SOB and orthopnea, 1+ b/l pitting edema, CT PE with mild interstitial edema, BNP >500, NYHA Class III c/f heart failure exacerbation. Suspect etiology of HF was either Afib with RVR vs dietary indiscretion vs medication non-adherence. No infectious symptoms. - TTE 2019 at [facility] with EF 20-25%, has not followed up with cardiology. Formerly saw [facility] but fell out of  system during [place]. Is supposed to be taking metop 100 mg qD, lasix 40 mg qD per medication sheet provided by daughter, but daughter reports patient does not take these medications regularly, and patient admits to skipping "a few doses".  Plan: - IV lasix 40 per ER. If patient is not neg -1-2L by 12AM, will re-dose - Repeat TTE in AM (last TTE in 2019) - Daily weights, 2L fluid restriction, sodium restricted diet - Keep K>4, Mg >2 - Not on any GDMT at home (besides metop 100 qD). Consider d/c with ACEi or ARNI to help with BP control. Would also benefit from spironolactone as tolerated  #. Afib w/ RVR, chronic, not on AC - CHADSVASC 5 - HR 140s-150s in the ER, hemodynamically stable, but was feeling SOB on arrival. Started on diltiazem drip per ER - Patient has known diagnosis of chronic Afib for >20 years. Per [facility] notes, not on [facility] per his decision. Pt reports he bruises easily, so he stopped taking it. Rate controlled with metoprolol 100 mg XL qD at home, but reports he may have skipped a few doses.  - Per medication sheet that daughter brought in, patient is prescribed Rivaroxaban 20 mg qD, but not taking it Plan: - Switch IV diltiazem to IV amiodarone drip for goal HR <130 bpm. Given his history of HFrEF, will avoid diltiazem - Prn metoprolol 12.5 mg q6h PO for HR >150 bpm - Will hold off on [facility] for now per patient preference. Will continue asa 324 mg PO qD - Discuss risks/benefits of anticoagulation with patient in the AM. Overnight, we discussed that it helps lower risk of stroke, but pt was hesitant to start blood thinner tonight  #. Abdominal pain Likely secondary to volume overload. CT AP without acute abdominal findings - diuresis as above  #. HTN Only on metop 100 at home. Will do prn hydralazine PO while hospitalized if needed - Consider d/c with ARNI or spironolactone for outpt HTN control  #. CAD s/p CABG x4 in 2009 - Reports history of CABG. Not  currently having  chest pain - Last ischemic evaluation was nuclear stress test in 2015 which showed inferior inferolateral infarction without associated ischemia Plan: - Continue asa 324 mg qD (not on AC at home for Afib per his preference) - Consider adding high-intensity statin for secondary prevention  Diet: 2g sodium restrict VTE ppx: SCDs Code status: full code  Risk Assessment/Risk Scores:    New York Heart Association (NYHA) Functional Class NYHA Class III  CHA2DS2-VASc Score = 5  This indicates a  % annual risk of stroke. The patient's score is based upon: HTN, HFrEF, age, male, prior MI  Severity of Illness: The appropriate patient status for this patient is INPATIENT. Inpatient status is judged to be reasonable and necessary in order to provide the required intensity of service to ensure the patient's safety. The patient's presenting symptoms, physical exam findings, and initial radiographic and laboratory data in the context of their chronic comorbidities is felt to place them at high risk for further clinical deterioration. Furthermore, it is not anticipated that the patient will be medically stable for discharge from the hospital within 2 midnights of admission.   * I certify that at the point of admission it is my clinical judgment that the patient will require inpatient hospital care spanning beyond 2 midnights from the point of admission due to high intensity of service, high risk for further deterioration and high frequency of surveillance required.*   For questions or updates, please contact [place] Please consult [email or web address] for contact info under     Signed, Loel Dubonnet, MD  10/15/2021 9:30 PM

## 2021-10-15 NOTE — ED Notes (Signed)
Patient transported to CT 

## 2021-10-15 NOTE — Progress Notes (Signed)
  Amiodarone Drug - Drug Interaction Consult Note  Recommendations: Continue as ordered by cardiology Monitor lytes as receiving lasix Avoid adding interacting medications  Amiodarone is metabolized by the cytochrome P450 system and therefore has the potential to cause many drug interactions. Amiodarone has an average plasma half-life of 50 days (range 20 to 100 days).   There is potential for drug interactions to occur several weeks or months after stopping treatment and the onset of drug interactions may be slow after initiating amiodarone.   []  Statins: Increased risk of myopathy. Simvastatin- restrict dose to 20mg  daily. Other statins: counsel patients to report any muscle pain or weakness immediately.  [x]  Anticoagulants: Amiodarone can increase anticoagulant effect. Consider warfarin dose reduction. Patients should be monitored closely and the dose of anticoagulant altered accordingly, remembering that amiodarone levels take several weeks to stabilize.              Patient was supposed to be on eliquis but is not taking citing too much bleeding.   []  Antiepileptics: Amiodarone can increase plasma concentration of phenytoin, the dose should be reduced. Note that small changes in phenytoin dose can result in large changes in levels. Monitor patient and counsel on signs of toxicity.  [x]  Beta blockers: increased risk of bradycardia, AV block and myocardial depression. Sotalol - avoid concomitant use.  Metoprolol 12.5 mg q6h PRN currently ordered  [x]   Calcium channel blockers (diltiazem and verapamil): increased risk of bradycardia, AV block and myocardial depression.  Received diltiazem IV in the ED. Supposed to be on  []   Cyclosporine: Amiodarone increases levels of cyclosporine. Reduced dose of cyclosporine is recommended.  []  Digoxin dose should be halved when amiodarone is started.  [x]  Diuretics: increased risk of cardiotoxicity if hypokalemia occurs.  []  Oral hypoglycemic  agents (glyburide, glipizide, glimepiride): increased risk of hypoglycemia. Patient's glucose levels should be monitored closely when initiating amiodarone therapy.   []  Drugs that prolong the QT interval:  Torsades de pointes risk may be increased with concurrent use - avoid if possible.  Monitor QTc, also keep magnesium/potassium WNL if concurrent therapy can't be avoided.  Antibiotics: e.g. fluoroquinolones, erythromycin.  Antiarrhythmics: e.g. quinidine, procainamide, disopyramide, sotalol.  Antipsychotics: e.g. phenothiazines, haloperidol.   Lithium, tricyclic antidepressants, and methadone.  , PharmD, BCPS 10/15/2021 9:04 PM ED Clinical Pharmacist -  [phone]

## 2021-10-15 NOTE — ED Provider Notes (Cosign Needed Addendum)
[facility]: [facility] EMERGENCY DEPARTMENT Provider Note   CSN: [id number] Arrival date & time: 10/15/21  8676     History  Chief Complaint  Patient presents with   Abdominal Pain    Douglas Hicks is a 88 y.o. male.   Abdominal Pain  88 year old male presents emergency department with complaints of abdominal fullness.  He states that he has been dealing with symptoms for approximately 2 months.  He states he has had bowel movements approximately every 2 days which have been firmer for the past 2 months.  He has been using MiraLAX at home to help induce bowel movements.  He was seen at the [place] 2 days ago and had a relatively unremarkable CT abdomen pelvis for any acute abnormality.  He presents emergency department today with continued feelings of fullness and concern for medication dependent induction of bowel movements.  He secondarily endorses more shortness of breath from his baseline.  He states that he thinks his shortness of breath is secondary to abdominal fullness which is pushing on his diaphragm.  He notes increased shortness of breath with daily activities.  He also reports some accompanying chest pain with activity but states that his not new for him.  He denies fever, chills, night sweats, nausea, vomiting, abdominal pain, hematochezia, melena, urinary symptoms.  Denies back pain, history of IV drug use, saddle anesthesia, weakness/sensory deficits from baseline and lower extremities, bowel/bladder dysfunction.  Past medical history significant for CHF, MI, lumbar laminectomy of 5/23.  Home Medications Prior to Admission medications   Medication Sig Start Date End Date Taking? Authorizing Provider  acetaminophen (TYLENOL) 325 MG tablet Take 975 mg by mouth every 6 (six) hours as needed for mild pain.    [provider]  aspirin 325 MG EC tablet Take 162.5 mg by mouth daily.    [provider]  Carboxymethylcellul-Glycerin (LUBRICATING EYE DROPS OP)  Place 1 drop into both eyes daily as needed (dry eyes).    [provider]  cholecalciferol (VITAMIN D3) 25 MCG (1000 UNIT) tablet Take 1,000 Units by mouth daily.    [provider]  docusate sodium (COLACE) 100 MG capsule Take 1 capsule (100 mg total) by mouth 2 (two) times daily. 07/09/21   Tressie Stalker, MD  furosemide (LASIX) 20 MG tablet Take 1 tablet (20 mg total) by mouth daily. 10/15/21  Yes Sherian Maroon A, PA  hydrocortisone 2.5 % cream Apply 1 application. topically 2 (two) times daily as needed (rash).    [provider]  HYDROmorphone (DILAUDID) 2 MG tablet Take 0.5 tablets (1 mg total) by mouth every 4 (four) hours as needed for severe pain. 07/09/21   Tressie Stalker, MD  metoprolol (TOPROL-XL) 200 MG 24 hr tablet Take 100 mg by mouth daily.    [provider]      Allergies    Oxycodone    Review of Systems   Review of Systems  Gastrointestinal:  Positive for abdominal pain.    Physical Exam Updated Vital Signs BP (!) 149/82   Pulse 97   Temp 98.2 F (36.8 C) (Oral)   Resp 17   Ht 5\' 6"  (1.676 m)   Wt 72.6 kg   SpO2 93%   BMI 25.82 kg/m  Physical Exam Vitals and nursing note reviewed.  Constitutional:      General: He is not in acute distress.    Appearance: He is well-developed.  HENT:     Head: Normocephalic and atraumatic.  Eyes:  Conjunctiva/sclera: Conjunctivae normal.  Cardiovascular:     Rate and Rhythm: Tachycardia present. Rhythm irregularly irregular.  Pulmonary:     Effort: Pulmonary effort is normal. No respiratory distress.     Breath sounds: Normal breath sounds.     Comments: Mild Rales noted bilaterally Abdominal:     Palpations: Abdomen is soft.     Tenderness: There is no abdominal tenderness.  Musculoskeletal:        General: No swelling.     Cervical back: Neck supple.     Right lower leg: 3+ Edema present.     Left lower leg: 2+ Edema present.     Comments: Surgical scar over lumbar spine  well-approximated with no surrounding erythema indicative of cellulitis.  No area of fluctuance or induration palpated.  No tenderness over area of scar.  Skin:    General: Skin is warm and dry.     Capillary Refill: Capillary refill takes less than 2 seconds.  Neurological:     Mental Status: He is alert.  Psychiatric:        Mood and Affect: Mood normal.     ED Results / Procedures / Treatments   Labs (all labs ordered are listed, but only abnormal results are displayed) Labs Reviewed  COMPREHENSIVE METABOLIC PANEL - Abnormal; Notable for the following components:      Result Value   Glucose, Bld 131 (*)    Calcium 8.6 (*)    Albumin 3.1 (*)    Total Bilirubin 1.5 (*)    All other components within normal limits  URINALYSIS, ROUTINE W REFLEX MICROSCOPIC - Abnormal; Notable for the following components:   Ketones, ur 5 (*)    Protein, ur 30 (*)    All other components within normal limits  BRAIN NATRIURETIC PEPTIDE - Abnormal; Notable for the following components:   B Natriuretic Peptide 579.0 (*)    All other components within normal limits  LIPASE, BLOOD  CBC  TROPONIN I (HIGH SENSITIVITY)  TROPONIN I (HIGH SENSITIVITY)    EKG EKG Interpretation  Date/Time:  Thursday October 15 2021 15:59:30 EDT Ventricular Rate:  108 PR Interval:    QRS Duration: 116 QT Interval:  397 QTC Calculation: 533 R Axis:   54 Text Interpretation: Atrial fibrillation Ventricular premature complex Incomplete left bundle branch block Borderline low voltage, extremity leads Probable left ventricular hypertrophy Prolonged QT interval Since last tracing rate faster Confirmed by Jacalyn Lefevre [phone]) on 10/15/2021 4:25:24 PM  Radiology CT Abdomen Pelvis W Contrast  Result Date: 10/15/2021 CLINICAL DATA:  Upper abdominal pain, nausea EXAM: CT ABDOMEN AND PELVIS WITH CONTRAST TECHNIQUE: Multidetector CT imaging of the abdomen and pelvis was performed using the standard protocol following bolus  administration of intravenous contrast. RADIATION DOSE REDUCTION: This exam was performed according to the departmental dose-optimization program which includes automated exposure control, adjustment of the mA and/or kV according to patient size and/or use of iterative reconstruction technique. CONTRAST:  OMNIPAQUE IOHEXOL 350 MG/ML SOLN COMPARISON:  None Available. FINDINGS: Lower chest: See chest CT report Hepatobiliary: No focal hepatic abnormality. Gallbladder unremarkable. Pancreas: No focal abnormality or ductal dilatation. Fatty replacement. Spleen: No focal abnormality.  Normal size. Adrenals/Urinary Tract: 5.7 cm exophytic cyst off the lower pole of the right kidney. This appears benign. No follow-up imaging recommended. No renal or ureteral stones. No hydronephrosis. Adrenal glands and urinary bladder unremarkable. Stomach/Bowel: Sigmoid diverticulosis. No active diverticulitis. Stomach and small bowel decompressed, unremarkable. Vascular/Lymphatic: Aortic atherosclerosis. No evidence of aneurysm or  adenopathy. Reproductive: Obscured by beam hardening artifact from bilateral hip replacements. Other: No free fluid or free air. Musculoskeletal: Bilateral replacements. Degenerative changes in the lumbar spine. No acute bony abnormality. IMPRESSION: Sigmoid diverticulosis.  No active diverticulitis. Aortic atherosclerosis. No acute findings in the abdomen or pelvis. Electronically Signed   By: Charlett NoseKevin  Dover M.D.   On: 10/15/2021 17:20   CT Angio Chest PE W/Cm &/Or Wo Cm  Result Date: 10/15/2021 CLINICAL DATA:  Pulmonary embolism (PE) suspected, high prob. Upper abdominal pain EXAM: CT ANGIOGRAPHY CHEST WITH CONTRAST TECHNIQUE: Multidetector CT imaging of the chest was performed using the standard protocol during bolus administration of intravenous contrast. Multiplanar CT image reconstructions and MIPs were obtained to evaluate the vascular anatomy. RADIATION DOSE REDUCTION: This exam was performed  according to the departmental dose-optimization program which includes automated exposure control, adjustment of the mA and/or kV according to patient size and/or use of iterative reconstruction technique. CONTRAST:  100mL OMNIPAQUE IOHEXOL 350 MG/ML SOLN COMPARISON:  None Available. FINDINGS: Cardiovascular: No filling defects in the pulmonary arteries to suggest pulmonary emboli. Cardiomegaly. Prior CABG. Aortic atherosclerosis. No aneurysm. Mediastinum/Nodes: Mildly prominent mediastinal lymph nodes. AP window lymph node has a short axis diameter of 12 mm. Similarly sized prevascular and subcarinal lymph nodes. Lungs/Pleura: Diffuse ground-glass opacities throughout the lungs may reflect mild edema. Interstitial thickening in the lung bases. No effusions. Upper Abdomen: No acute findings Musculoskeletal: Chest wall soft tissues are unremarkable. No acute bony abnormality. Review of the MIP images confirms the above findings. IMPRESSION: No evidence of pulmonary embolus. Cardiomegaly. Interstitial prominence and ground-glass opacities in the lungs or reflect mild interstitial edema. Borderline mediastinal lymph nodes, likely reactive. Aortic Atherosclerosis (ICD10-I70.0).  Prior CABG Electronically Signed   By: Charlett NoseKevin  Dover M.D.   On: 10/15/2021 17:17    Procedures .Critical Care  Performed by: Peter Garterobbins, Tabia Landowski A, PA Authorized by: Peter Garterobbins, Emmalie Haigh A, PA   Critical care provider statement:    Critical care time (minutes):  35   Critical care was necessary to treat or prevent imminent or life-threatening deterioration of the following conditions:  Circulatory failure   Critical care was time spent personally by me on the following activities:  Development of treatment plan with patient or surrogate, discussions with consultants, evaluation of patient's response to treatment, examination of patient, ordering and performing treatments and interventions, ordering and review of laboratory studies, pulse  oximetry, ordering and review of radiographic studies, re-evaluation of patient's condition and review of old charts   I assumed direction of critical care for this patient from another provider in my specialty: no     Care discussed with: admitting provider       Medications Ordered in ED Medications  metoprolol succinate (TOPROL-XL) 24 hr tablet 100 mg (100 mg Oral Given 10/15/21 1624)  metoprolol tartrate (LOPRESSOR) injection 5 mg (has no administration in time range)  furosemide (LASIX) injection 40 mg (40 mg Intravenous Given 10/15/21 1549)  iohexol (OMNIPAQUE) 350 MG/ML injection 100 mL (100 mLs Intravenous Contrast Given 10/15/21 1706)    ED Course/ Medical Decision Making/ A&P Clinical Course as of 10/15/21 2055  Thu Oct 15, 2021  1827 Discussed with patient and family regarding drawing second troponin but patient and family insisted on leaving before test results written. [CR]  1912 Reevaluation the patient showed compliance with collecting second troponin as well as open to the possibility of being admitted for further evaluation. [CR]  2043 Dr. Evette Doffingannu of cardiology saw the patient independently.  She agreed to admit the patient. [CR]    Clinical Course User Index [CR] Peter Garter, PA                           Medical Decision Making Amount and/or Complexity of Data Reviewed Labs: ordered. Radiology: ordered.  Risk Prescription drug management. Decision regarding hospitalization.   This patient presents to the ED for concern of abdominal fullness, this involves an extensive number of treatment options, and is a complaint that carries with it a high risk of complications and morbidity.  The differential diagnosis includes bowel obstruction, CHF, myocardial infarction, AAA, mesenteric ischemia, appendicitis, diverticulitis, DKA, gastritis, gastroenteritis, AMI, nephrolithiasis, pancreatitis, peritonitis, adrenal insufficiency,lead poisoning, iron toxicity, intestinal  ischemia, constipation, UTI,SBO/LBO, splenic rupture, biliary disease, IBD, IBS, PUD, or hepatitis.    Co morbidities that complicate the patient evaluation  See HPI   Additional history obtained:  Additional history obtained from EMR External records from outside source obtained and reviewed including CT abdomen pelvis from 10/13/2021   Lab Tests:  I Ordered, and personally interpreted labs.  The pertinent results include: BNP of 579 indicating clinical suspicion of acute on chronic CHF.  No leukocytosis.  No anemia noted.  Urinalysis insignificant of infection or gross blood.  Initial troponin 11; patient declined repeat troponin.  Electrolytes without abnormality.  GFR within normal range.  Liver function test within normal range.   Imaging Studies ordered:  I ordered imaging studies including CT chest abdomen pelvis I independently visualized and interpreted imaging which showed  CT angio chest: Sigmoid diverticulosis.  No acute diverticulitis.  Aortic atherosclerosis.  No acute findings in the abdomen or pelvis CT abdomen pelvis: No evidence of PE.  Cardiomegaly.  Interstitial prominence and groundglass opacities in the lungs reflect mild incisional edema.  Borderline mediastinal lymph node.  Aortic atherosclerosis.  Prior CABG. I agree with the radiologist interpretation  Cardiac Monitoring: / EKG:  The patient was maintained on a cardiac monitor.  I personally viewed and interpreted the cardiac monitored which showed an underlying rhythm of: Atrial fibrillation with PVC.  Incomplete left bundle branch block.  Prolonged QTc C of 533  Consultations Obtained:  See ED course   Problem List / ED Course / Critical interventions / Medication management  CHF exacerbation/atrial fibrillation with mildly rapid rate. I ordered medication including Lasix for diuresis.  Toprol-XL as well as metoprolol tartrate for rate control.  Cardizem drip for rate control. Reevaluation of the  patient after these medicines showed that the patient improved I have reviewed the patients home medicines and have made adjustments as needed   Social Determinants of Health:  Denies illicit drug use.  Denies alcohol use.   Test / Admission - Considered:  Acute on chronic CHF/atrial fibrillation with mild rapid ventricular rate Vitals signs significant for initial tachycardia and hypertensive with a heart rate of of 115.  Patient was given at home metoprolol as well as Lopressor IV with no response of heart rate.  Diltiazem drip was begun with proper response of heart rate as well as decrease in blood pressure.   Laboratory/imaging studies significant for: See above Patient's symptoms most likely secondary to acute on chronic CHF exacerbation as well as atrial fibrillation with rapid ventricular response.  Patient diuresed well in the emergency department responded somewhat to said therapy.  Patient was also begun on metoprolol as well as Cardizem drip with appropriate response of heart rate.  Given the fact  of patient's heart rate requiring IV Cardizem drip to control, admission deemed necessary. Patient and family were consulted regarding patient's unresponsiveness to medications begun in the emergency department and they agreed with admission.  Cardiology was consulted and manner depicted in ED course of also agreed with admission.  Patient was stable upon admission.  Final Clinical Impression(s) / ED Diagnoses Final diagnoses:  Acute on chronic congestive heart failure, unspecified heart failure type [facility])  Atrial fibrillation with tachycardic ventricular rate (HCC)  Aortic atherosclerosis [facility])  Sigmoid diverticulosis    Rx / DC Orders    Peter Garter, PA 10/15/21 1825    Jacalyn Lefevre, MD 10/15/21 2055    Peter Garter, [place] 10/17/21 Marcie Bal    Jacalyn Lefevre, MD 10/20/21 [phone]

## 2021-10-15 NOTE — ED Notes (Signed)
Patient bed changed, linen changed, provided perineal care for episode of urine and stool.

## 2021-10-16 ENCOUNTER — Encounter (HOSPITAL_COMMUNITY): Payer: Self-pay | Admitting: Student

## 2021-10-16 ENCOUNTER — Inpatient Hospital Stay (HOSPITAL_BASED_OUTPATIENT_CLINIC_OR_DEPARTMENT_OTHER): Payer: No Typology Code available for payment source

## 2021-10-16 DIAGNOSIS — I5020 Unspecified systolic (congestive) heart failure: Secondary | ICD-10-CM

## 2021-10-16 DIAGNOSIS — I517 Cardiomegaly: Secondary | ICD-10-CM | POA: Diagnosis not present

## 2021-10-16 LAB — ECHOCARDIOGRAM COMPLETE
Calc EF: 30.8 %
Height: 66 in
S' Lateral: 4.4 cm
Single Plane A2C EF: 42.6 %
Single Plane A4C EF: 18.3 %
Weight: 2560 oz

## 2021-10-16 LAB — CBC
HCT: 39.8 % (ref 39.0–52.0)
Hemoglobin: 13.2 g/dL (ref 13.0–17.0)
MCH: 30.1 pg (ref 26.0–34.0)
MCHC: 33.2 g/dL (ref 30.0–36.0)
MCV: 90.9 fL (ref 80.0–100.0)
Platelets: 274 10*3/uL (ref 150–400)
RBC: 4.38 MIL/uL (ref 4.22–5.81)
RDW: 13.3 % (ref 11.5–15.5)
WBC: 9.7 10*3/uL (ref 4.0–10.5)
nRBC: 0 % (ref 0.0–0.2)

## 2021-10-16 LAB — BASIC METABOLIC PANEL
Anion gap: 11 (ref 5–15)
BUN: 20 mg/dL (ref 8–23)
CO2: 24 mmol/L (ref 22–32)
Calcium: 8.6 mg/dL — ABNORMAL LOW (ref 8.9–10.3)
Chloride: 102 mmol/L (ref 98–111)
Creatinine, Ser: 1.24 mg/dL (ref 0.61–1.24)
GFR, Estimated: 56 mL/min — ABNORMAL LOW (ref >60)
Glucose, Bld: 135 mg/dL — ABNORMAL HIGH (ref 70–99)
Potassium: 4.2 mmol/L (ref 3.5–5.1)
Sodium: 137 mmol/L (ref 135–145)

## 2021-10-16 LAB — TSH: TSH: 1.766 u[IU]/mL (ref 0.350–4.500)

## 2021-10-16 MED ORDER — SPIRONOLACTONE 12.5 MG HALF TABLET
12.5000 mg | ORAL_TABLET | Freq: Every day | ORAL | Status: DC
  Administered 2021-10-16 – 2021-10-17 (×2): 12.5 mg via ORAL
  Filled 2021-10-16 (×2): qty 1

## 2021-10-16 MED ORDER — LOSARTAN POTASSIUM 25 MG PO TABS
25.0000 mg | ORAL_TABLET | Freq: Every day | ORAL | Status: DC
  Administered 2021-10-16 – 2021-10-17 (×2): 25 mg via ORAL
  Filled 2021-10-16 (×2): qty 1

## 2021-10-16 MED ORDER — MELATONIN 3 MG PO TABS
3.0000 mg | ORAL_TABLET | Freq: Every day | ORAL | Status: DC
  Administered 2021-10-16: 3 mg via ORAL
  Filled 2021-10-16: qty 1

## 2021-10-16 MED ORDER — APIXABAN 5 MG PO TABS
5.0000 mg | ORAL_TABLET | Freq: Two times a day (BID) | ORAL | Status: DC
  Administered 2021-10-16 – 2021-10-17 (×4): 5 mg via ORAL
  Filled 2021-10-16 (×4): qty 1

## 2021-10-16 MED ORDER — FUROSEMIDE 10 MG/ML IJ SOLN
40.0000 mg | Freq: Once | INTRAMUSCULAR | Status: AC
Start: 2021-10-16 — End: 2021-10-16
  Administered 2021-10-16: 40 mg via INTRAVENOUS
  Filled 2021-10-16: qty 4

## 2021-10-16 MED ORDER — METOPROLOL SUCCINATE ER 25 MG PO TB24
12.5000 mg | ORAL_TABLET | Freq: Every day | ORAL | Status: DC
  Administered 2021-10-16 – 2021-10-17 (×2): 12.5 mg via ORAL
  Filled 2021-10-16 (×2): qty 1

## 2021-10-16 NOTE — ED Notes (Signed)
Pt sts the if he get no pain relief he would be getting up and going home. C/o lower abdominal pain and burning when he urinates. Given PO pyridium, Vicodin, and ativan.

## 2021-10-16 NOTE — Progress Notes (Signed)
Heart Failure Nurse Navigator Progress Note    Patient has a follow up appointment established with Dr. Elease Hashimoto on 10/19/2021.  Rhae Hammock, BSN, Scientist, clinical (histocompatibility and immunogenetics) Only

## 2021-10-16 NOTE — ED Notes (Signed)
Pt woke up confused asking where he was at and for assistance to bathroom. Pt reoriented informed he was in the hospital and has a foley to help empty his bladder. Pt understanding.

## 2021-10-16 NOTE — ED Notes (Signed)
Pt de-stated to 88-89% on room air. Was placed on 2 L nasal cannula with improvement. SpO2 93%. Pt denies feeling SOB. RR 30-40s. Increasing agitation d/t complaints of burning with urination following lasix administration. Daughter and wife currently at bedside.

## 2021-10-16 NOTE — Progress Notes (Signed)
  Echocardiogram 2D Echocardiogram has been performed.  Roosvelt Maser F 10/16/2021, 11:18 AM

## 2021-10-16 NOTE — TOC Progression Note (Signed)
Transition of Care [facility] - Progression Note    Patient Details  Name: Douglas Hicks MRN: [id number] Date of Birth: 1930/11/13  Transition of Care [facility]) CM/SW Contact  Beckie Busing, RN Phone Number:[phone]  10/16/2021, 4:11 PM  Clinical Narrative:     Transition of Care [facility]) Screening Note   Patient Details  Name: Douglas Hicks Date of Birth: 08-02-1930   Transition of Care [facility]) CM/SW Contact:    Beckie Busing, RN Phone Number: 10/16/2021, 4:11 PM    Transition of Care Department [facility]) has reviewed patient and no TOC needs have been identified at this time. We will continue to monitor patient advancement through interdisciplinary progression rounds.          Expected Discharge Plan and Services                                                 Social Determinants of Health (SDOH) Interventions    Readmission Risk Interventions     No data to display

## 2021-10-16 NOTE — Progress Notes (Addendum)
Progress Note  Patient Name: Douglas Hicks Date of Encounter: 10/16/2021  [facility] Cardiologist: None New to [facility]. Lost to follow up by [facility]  Subjective   Denies any CP or SOB. Extremely Hard of Hearing, unable to understand any other instruction.   Inpatient Medications    Scheduled Meds:  aspirin  325 mg Oral Daily   sodium chloride flush  3 mL Intravenous Q12H   Continuous Infusions:  sodium chloride     amiodarone 30 mg/hr (10/16/21 0233)   PRN Meds: sodium chloride, acetaminophen, metoprolol tartrate, ondansetron (ZOFRAN) IV, sodium chloride flush   Vital Signs    Vitals:   10/16/21 0500 10/16/21 0545 10/16/21 0600 10/16/21 0700  BP: 129/63  119/62   Pulse: 73 71 (!) 50   Resp: 18 17 (!) 25   Temp:    98.3 F (36.8 C)  TempSrc:      SpO2: 92% 93% 91%   Weight:      Height:        Intake/Output Summary (Last 24 hours) at 10/16/2021 0811 Last data filed at 10/16/2021 0618 Gross per 24 hour  Intake --  Output 1070 ml  Net -1070 ml      10/15/2021    4:26 PM 07/08/2021    6:59 AM 07/07/2021   10:56 AM  Last 3 Weights  Weight (lbs) 160 lb 165 lb 160 lb  Weight (kg) 72.576 kg 74.844 kg 72.576 kg      Telemetry    Atrial fibrillation with HR controlled in the 70s - Personally Reviewed  ECG    Atrial fibrillation with HR 108, LVH - Personally Reviewed  Physical Exam   GEN: No acute distress.   Neck: No JVD Cardiac: irregularly irregular, no murmurs, rubs, or gallops.  Respiratory: Clear to auscultation bilaterally. GI: Soft, nontender, non-distended  MS: No edema; No deformity. Neuro:  Nonfocal  Psych: Normal affect   Labs    High Sensitivity Troponin:   Recent Labs  Lab 10/15/21 1600 10/15/21 1836  TROPONINIHS 11 13     Chemistry Recent Labs  Lab 10/15/21 0846 10/16/21 0323  NA 137 137  K 4.6 4.2  CL 104 102  CO2 24 24  GLUCOSE 131* 135*  BUN 17 20  CREATININE 1.11 1.24  CALCIUM 8.6* 8.6*  PROT 6.8  --    ALBUMIN 3.1*  --   AST 18  --   ALT 13  --   ALKPHOS 61  --   BILITOT 1.5*  --   GFRNONAA >60 56*  ANIONGAP 9 11    Lipids No results for input(s): "CHOL", "TRIG", "HDL", "LABVLDL", "LDLCALC", "CHOLHDL" in the last 168 hours.  Hematology Recent Labs  Lab 10/15/21 0846 10/16/21 0323  WBC 8.9 9.7  RBC 4.45 4.38  HGB 13.2 13.2  HCT 41.3 39.8  MCV 92.8 90.9  MCH 29.7 30.1  MCHC 32.0 33.2  RDW 13.4 13.3  PLT 261 274   Thyroid  Recent Labs  Lab 10/16/21 0323  TSH 1.766    BNP Recent Labs  Lab 10/15/21 1600  BNP 579.0*    DDimer No results for input(s): "DDIMER" in the last 168 hours.   Radiology    CT Abdomen Pelvis W Contrast  Result Date: 10/15/2021 CLINICAL DATA:  Upper abdominal pain, nausea EXAM: CT ABDOMEN AND PELVIS WITH CONTRAST TECHNIQUE: Multidetector CT imaging of the abdomen and pelvis was performed using the standard protocol following bolus administration of intravenous contrast. RADIATION  DOSE REDUCTION: This exam was performed according to the departmental dose-optimization program which includes automated exposure control, adjustment of the mA and/or kV according to patient size and/or use of iterative reconstruction technique. CONTRAST:  OMNIPAQUE IOHEXOL 350 MG/ML SOLN COMPARISON:  None Available. FINDINGS: Lower chest: See chest CT report Hepatobiliary: No focal hepatic abnormality. Gallbladder unremarkable. Pancreas: No focal abnormality or ductal dilatation. Fatty replacement. Spleen: No focal abnormality.  Normal size. Adrenals/Urinary Tract: 5.7 cm exophytic cyst off the lower pole of the right kidney. This appears benign. No follow-up imaging recommended. No renal or ureteral stones. No hydronephrosis. Adrenal glands and urinary bladder unremarkable. Stomach/Bowel: Sigmoid diverticulosis. No active diverticulitis. Stomach and small bowel decompressed, unremarkable. Vascular/Lymphatic: Aortic atherosclerosis. No evidence of aneurysm or adenopathy.  Reproductive: Obscured by beam hardening artifact from bilateral hip replacements. Other: No free fluid or free air. Musculoskeletal: Bilateral replacements. Degenerative changes in the lumbar spine. No acute bony abnormality. IMPRESSION: Sigmoid diverticulosis.  No active diverticulitis. Aortic atherosclerosis. No acute findings in the abdomen or pelvis. Electronically Signed   By: Charlett Nose M.D.   On: 10/15/2021 17:20   CT Angio Chest PE W/Cm &/Or Wo Cm  Result Date: 10/15/2021 CLINICAL DATA:  Pulmonary embolism (PE) suspected, high prob. Upper abdominal pain EXAM: CT ANGIOGRAPHY CHEST WITH CONTRAST TECHNIQUE: Multidetector CT imaging of the chest was performed using the standard protocol during bolus administration of intravenous contrast. Multiplanar CT image reconstructions and MIPs were obtained to evaluate the vascular anatomy. RADIATION DOSE REDUCTION: This exam was performed according to the departmental dose-optimization program which includes automated exposure control, adjustment of the mA and/or kV according to patient size and/or use of iterative reconstruction technique. CONTRAST:  OMNIPAQUE IOHEXOL 350 MG/ML SOLN COMPARISON:  None Available. FINDINGS: Cardiovascular: No filling defects in the pulmonary arteries to suggest pulmonary emboli. Cardiomegaly. Prior CABG. Aortic atherosclerosis. No aneurysm. Mediastinum/Nodes: Mildly prominent mediastinal lymph nodes. AP window lymph node has a short axis diameter of 12 mm. Similarly sized prevascular and subcarinal lymph nodes. Lungs/Pleura: Diffuse ground-glass opacities throughout the lungs may reflect mild edema. Interstitial thickening in the lung bases. No effusions. Upper Abdomen: No acute findings Musculoskeletal: Chest wall soft tissues are unremarkable. No acute bony abnormality. Review of the MIP images confirms the above findings. IMPRESSION: No evidence of pulmonary embolus. Cardiomegaly. Interstitial prominence and ground-glass  opacities in the lungs or reflect mild interstitial edema. Borderline mediastinal lymph nodes, likely reactive. Aortic Atherosclerosis (ICD10-I70.0).  Prior CABG Electronically Signed   By: Charlett Nose M.D.   On: 10/15/2021 17:17    Cardiac Studies   Echo 10/31/2013 LV EF: 30% -   35%   -------------------------------------------------------------------  Indications:      V728.1 Pre-op evaluation.  Atrial fibrillation -  427.31.  CAD of native vessels 414.01.   -------------------------------------------------------------------  History:   PMH:  Acquired from the patient and from the patient&'s  chart.  Systolic murmur.  Atrial fibrillation.  Coronary artery  disease.   -------------------------------------------------------------------  Study Conclusions   - Left ventricle: The cavity size was normal. There was mild    concentric hypertrophy. Systolic function was moderately to    severely reduced. The estimated ejection fraction was in the    range of 30% to 35%. Wall motion was normal; there were no    regional wall motion abnormalities. The study was not technically    sufficient to allow evaluation of LV diastolic dysfunction due to    atrial fibrillation.  - Aortic valve: Trileaflet;  mildly thickened, moderately calcified    leaflets. Transvalvular velocity was within the normal range.    There was no stenosis.  - Mitral valve: There was mild regurgitation.  - Left atrium: The atrium was moderately to severely dilated.  - Tricuspid valve: There was mild regurgitation.  - Pulmonary arteries: Systolic pressure was at the upper limits of    normal. PA peak pressure: 36 mm Hg (S).   Impressions:   - There is no significant difference when compared to the study    from 2008.    Patient Profile     88 y.o. male with PMH of chronic atrial fibrillation (not on anticoagulation), CAD s/p CABG 2000, ICM, chronic systolic CHF, HTN and history of bilateral PE (diagnosed during  hospitalization in Sept 2019). Per [facility] record, patient has a h/o chronic afib not on [facility], started on Eliquis in 2019 due to DVT and PE, however later came off of it. Reportedly had a Rx of Xarelto but he was not taking it per daughter. On lasix 40mg  daily and Toprol XL 100 mg daily, however patient only take them sporadically. He presented with afib with RVR, orthopnea and abdominal distention concerning for decompensated CHF.   Assessment & Plan    Acute decompensated systolic CHF  - received 40mg  IV lasix last night and another dose this morning. I/O -1L. LE edema resolved, lung is clear at this point. Abdomen soft. No problem sleeping flat last night.   - unclear how long his symptom was going on since patient was extremely hard of hearing which made communication very difficult. Fortunately, he appears to be near euvolemic level  - Obtain echocardiogram. EF reportedly 25% in 2019  - since patient is now near euvolemic level, may consider slow introduction of low dose toprol XL (coreg listed as allergy). Switch to oral lasix 40mg  daily tomorrow  Chronic atrial fibrillation with RVR  - reportedly on toprol XL 100mg  daily at home, however patient often miss his medication. Per [facility] hospital note from 11/2017, patient already had a diagnosis of chronic afib prior to that admission and was not taking AC per his decision, he was started on DVT/PE dosing of eliquis during hospitalization in 2019 due to finding of PE and DVT  - daughter reportedly showed patient has a Rx of Xarelto but he was not taking it.   - his IV Cardizem switched to IV amiodarone last night. Currently still in afib, however HR has improved to 70s. (Tele documented some sporadic sinus rhythm from yesterday, however it is quite questionable whether they are actually his telemetry as he probably was hooked up to telemetry after those recordings)  - overnight card fellow had a discussion with patient and started him on 325mg  ASA, will  need to have another discussion once the daughter gets here regarding anticoagulations.   Abdominal pain: secondary to volume overload. 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  For questions or updates, please contact [facility] Please consult [email or web address] for contact info under   Signed, Azalee Course, PA  10/16/2021, 8:11 AM   As above, patient seen and examined.  He denies dyspnea this morning.  No chest pain.  He remains in atrial fibrillation but heart rate is controlled.    1 acute on chronic systolic congestive heart failure-will continue diuresis with Lasix 40 mg IV daily and potentially transition to oral Lasix tomorrow.  Add spironolactone 12.5 mg daily.  Follow renal function closely.  Repeat echocardiogram.  Could consider addition of Farxiga but apparently there has been difficulties with medication compliance and would like to limit number of medications if possible.  2 permanent atrial fibrillation-he has been placed on IV amiodarone.  Could transition to oral amiodarone tomorrow.  Add low-dose beta-blockade.  Discontinue aspirin.  Instead treat with apixaban 5 mg twice daily.  3 ischemic cardiomyopathy-history of significant LV dysfunction.  Add losartan 25 mg daily (could potentially transition to [facility] later if blood  pressure allows).  And toprol 12.5 mg daily.  Titrate medications as tolerated.  4 coronary artery disease-discontinue aspirin given need for apixaban.  Intolerant to statins.  5 hyperlipidemia-can consider addition of Repatha or Praluent as an outpatient if patient agreeable.  Olga Millers, MD

## 2021-10-16 NOTE — Progress Notes (Signed)
An USGPIV (ultrasound guided PIV) has been placed for short-term vasopressor infusion. A correctly placed ivWatch must be used when administering Vasopressors. Should this treatment be needed beyond 72 hours, central line access should be obtained.  It will be the responsibility of the bedside nurse to follow best practice to prevent extravasations.   ?

## 2021-10-17 DIAGNOSIS — E785 Hyperlipidemia, unspecified: Secondary | ICD-10-CM

## 2021-10-17 DIAGNOSIS — I2510 Atherosclerotic heart disease of native coronary artery without angina pectoris: Secondary | ICD-10-CM

## 2021-10-17 DIAGNOSIS — I739 Peripheral vascular disease, unspecified: Secondary | ICD-10-CM

## 2021-10-17 DIAGNOSIS — I10 Essential (primary) hypertension: Secondary | ICD-10-CM

## 2021-10-17 DIAGNOSIS — I4891 Unspecified atrial fibrillation: Principal | ICD-10-CM | POA: Diagnosis present

## 2021-10-17 MED ORDER — LOSARTAN POTASSIUM 25 MG PO TABS: 25.0000 mg | ORAL_TABLET | Freq: Every day | ORAL | 11 refills | Status: AC

## 2021-10-17 MED ORDER — METOPROLOL SUCCINATE ER 25 MG PO TB24: 25.0000 mg | ORAL_TABLET | Freq: Every day | ORAL | 11 refills | Status: AC

## 2021-10-17 MED ORDER — FUROSEMIDE 40 MG PO TABS: 40.0000 mg | ORAL_TABLET | Freq: Every day | ORAL | 11 refills | Status: AC

## 2021-10-17 MED ORDER — SPIRONOLACTONE 25 MG PO TABS: 12.5000 mg | ORAL_TABLET | Freq: Every day | ORAL | 11 refills | Status: AC

## 2021-10-17 MED ORDER — CHLORHEXIDINE GLUCONATE CLOTH 2 % EX PADS
6.0000 | MEDICATED_PAD | Freq: Every day | CUTANEOUS | Status: DC
  Administered 2021-10-17: 6 via TOPICAL

## 2021-10-17 MED ORDER — APIXABAN 5 MG PO TABS: 5.0000 mg | ORAL_TABLET | Freq: Two times a day (BID) | ORAL | 11 refills | Status: AC

## 2021-10-17 MED ORDER — TRAZODONE HCL 50 MG PO TABS
50.0000 mg | ORAL_TABLET | Freq: Once | ORAL | Status: AC
  Administered 2021-10-17: 50 mg via ORAL
  Filled 2021-10-17: qty 1

## 2021-10-17 NOTE — Progress Notes (Signed)
Pt (+) void. Light red in color; most likely residual from foley removal. Eliquis given. IVs removed. VS wnL and as per flow. Telemetry dc'd. Education provided to patient and family using teach-back method. All questions and concerns addressed. Pt wheel-chaired down by dtr (ex-[employer] RN) and wife.

## 2021-10-17 NOTE — Progress Notes (Signed)
Foley removed. TOV started. °

## 2021-10-17 NOTE — Progress Notes (Signed)
Pt and family educated on discharge plan. Awaiting on Pt to void. If successful, he should take his PO Eliquis before going home.

## 2021-10-17 NOTE — Discharge Summary (Cosign Needed Addendum)
Discharge Summary    Patient ID: Douglas Hicks MRN: [id number]; DOB: June 16, 1930  Admit date: 10/15/2021 Discharge date: 10/17/2021  PCP:  [facility] Cardiologist:  Kirk Ruths, MD        Discharge Diagnoses    Principal Problem:   Acute on chronic HFrEF (heart failure with reduced ejection fraction) ([place]) Active Problems:   Atrial fibrillation with RVR ([place])   Coronary artery disease involving native coronary artery of native heart without angina pectoris   PAD (peripheral artery disease) ([place])   Essential hypertension   Hyperlipidemia LDL goal <70   Diagnostic Studies/Procedures    ECHO COMPLETE WO IMAGING ENHANCING AGENT 10/16/2021 1. Left ventricular ejection fraction, by estimation, is 25-30%. The left ventricle has severely decreased function. The left ventricle demonstrates regional wall motion abnormalities (see scoring diagram/findings for description). There is akinesis of the left ventricular, basal-mid inferior wall and severe global hypokinesis. 2. Right ventricular systolic function is normal. The right ventricular size is normal. There is severely elevated pulmonary artery systolic pressure. The estimated right ventricular systolic pressure is 123456 mmHg. 3. Right atrial size was mild to moderately dilated. 4. The mitral valve is normal in structure. Mild mitral valve regurgitation. No evidence of mitral stenosis. 5. Tricuspid valve regurgitation is mild to moderate. 6. The aortic valve is normal in structure. Aortic valve regurgitation is trivial. No aortic stenosis is present. 7. Aortic dilatation noted. There is mild dilatation of the aortic root, measuring 39 mm. 8. The inferior vena cava is dilated in size with <50% respiratory variability, suggesting right atrial pressure of 15 mmHg.    _____________   History of Present Illness     Douglas Hicks is a 88 y.o. male with coronary artery disease s/p CABG in  2009, (HFrEF) heart failure with reduced ejection fraction with EF 20%, permanent atrial fibrillation, hypertension, hyperlipidemia (intol to statins), peripheral arterial disease, prior DVT/pulmonary embolism who presented with shortness of breath, abdominal pain, orthopnea.  He did have a hx of non-adherence with medications. He was previously seeing [facility] but was lost to follow up during [place]. He was noted to have AFib with RVR in the ED with HR 130s and CT of the chest did show interstitial edema (no pulmonary embolism). BNP was > 500. He was admitted for evaluation and management of a/c (HFrEF) heart failure with reduced ejection fraction.   Hospital Course     Consultants: none    He was admitted and started on IV Furosemide for diuresis. He was also started on IV Amiodarone for AFib with RVR. Over 48 hours, he had significant improvement in volume status. He was evaluated by Dr. Lovena Le this morning and felt to be stable and ready for DC to home. He was only on Metoprolol succinate prior to admission. It was not clear that he was taking this consistently. Echocardiogram demonstrated EF 25-30%, severely elevated PASP with RVSP of 69.2, mild mitral regurgitation and aortic root. At DC, GDMT will include Furosemide 40 mg once daily, Spironolactone 12.5 mg once daily, Toprol XL 25 mg once daily, Losartan 25 mg once daily. His Amiodarone was DC'd. It was felt that his Toprol can be titrated as an OP for rate control. It was also recommended that he go home on Eliquis 5 mg twice daily (wt 68 kg, SCr < 1.5). Recommendation was to consider PCSK9i as an OP for lipid management. He will need a TOC follow up in 7 days  along with a f/u BMET.   Did the patient have an acute coronary syndrome (MI, NSTEMI, STEMI, etc) this admission?:  No                               Did the patient have a percutaneous coronary intervention (stent / angioplasty)?:  No.       The patient will be scheduled for a  TOC follow up appointment in 7 days.  A message has been sent to the [facility] and Scheduling Pool at the office where the patient should be seen for follow up.  _____________  Discharge Vitals Blood pressure 116/87, pulse 80, temperature (!) 96.5 F (35.8 C), temperature source Axillary, resp. rate 16, height 5\' 6"  (1.676 m), weight 68 kg, SpO2 90 %.  Filed Weights   10/15/21 1626 10/16/21 1540 10/17/21 0445  Weight: 72.6 kg 68 kg 68 kg    Labs & Radiologic Studies    CBC Recent Labs    10/15/21 0846 10/16/21 0323  WBC 8.9 9.7  HGB 13.2 13.2  HCT 41.3 39.8  MCV 92.8 90.9  PLT 261 [id number]   Basic Metabolic Panel Recent Labs    10/15/21 0846 10/16/21 0323  NA 137 137  K 4.6 4.2  CL 104 102  CO2 24 24  GLUCOSE 131* 135*  BUN 17 20  CREATININE 1.11 1.24  CALCIUM 8.6* 8.6*   Liver Function Tests Recent Labs    10/15/21 0846  AST 18  ALT 13  ALKPHOS 61  BILITOT 1.5*  PROT 6.8  ALBUMIN 3.1*   Recent Labs    10/15/21 0846  LIPASE 23   High Sensitivity Troponin:   Recent Labs  Lab 10/15/21 1600 10/15/21 1836  TROPONINIHS 11 13     Thyroid Function Tests Recent Labs    10/16/21 0323  TSH 1.766   _____________   CT Abdomen Pelvis W Contrast  Result Date: 10/15/2021 CLINICAL DATA:  Upper abdominal pain, nausea EXAM: CT ABDOMEN AND PELVIS WITH CONTRAST TECHNIQUE: Multidetector CT imaging of the abdomen and pelvis was performed using the standard protocol following bolus administration of intravenous contrast. RADIATION DOSE REDUCTION: This exam was performed according to the departmental dose-optimization program which includes automated exposure control, adjustment of the mA and/or kV according to patient size and/or use of iterative reconstruction technique. CONTRAST:  156mL OMNIPAQUE IOHEXOL 350 MG/ML SOLN COMPARISON:  None Available. FINDINGS: Lower chest: See chest CT report Hepatobiliary: No focal hepatic abnormality. Gallbladder unremarkable. Pancreas: No  focal abnormality or ductal dilatation. Fatty replacement. Spleen: No focal abnormality.  Normal size. Adrenals/Urinary Tract: 5.7 cm exophytic cyst off the lower pole of the right kidney. This appears benign. No follow-up imaging recommended. No renal or ureteral stones. No hydronephrosis. Adrenal glands and urinary bladder unremarkable. Stomach/Bowel: Sigmoid diverticulosis. No active diverticulitis. Stomach and small bowel decompressed, unremarkable. Vascular/Lymphatic: Aortic atherosclerosis. No evidence of aneurysm or adenopathy. Reproductive: Obscured by beam hardening artifact from bilateral hip replacements. Other: No free fluid or free air. Musculoskeletal: Bilateral replacements. Degenerative changes in the lumbar spine. No acute bony abnormality.  IMPRESSION: Sigmoid diverticulosis.  No active diverticulitis. Aortic atherosclerosis. No acute findings in the abdomen or pelvis.  Electronically Signed   By: Rolm Baptise M.D.   On: 10/15/2021 17:20   CT Angio Chest PE W/Cm &/Or Wo Cm Result Date: 10/15/2021 CLINICAL DATA:  Pulmonary embolism (PE) suspected, high prob. Upper abdominal pain EXAM: CT ANGIOGRAPHY  CHEST WITH CONTRAST TECHNIQUE: Multidetector CT imaging of the chest was performed using the standard protocol during bolus administration of intravenous contrast. Multiplanar CT image reconstructions and MIPs were obtained to evaluate the vascular anatomy. RADIATION DOSE REDUCTION: This exam was performed according to the departmental dose-optimization program which includes automated exposure control, adjustment of the mA and/or kV according to patient size and/or use of iterative reconstruction technique. CONTRAST:  OMNIPAQUE IOHEXOL 350 MG/ML SOLN COMPARISON:  None Available. FINDINGS: Cardiovascular: No filling defects in the pulmonary arteries to suggest pulmonary emboli. Cardiomegaly. Prior CABG. Aortic atherosclerosis. No aneurysm. Mediastinum/Nodes: Mildly prominent mediastinal lymph  nodes. AP window lymph node has a short axis diameter of 12 mm. Similarly sized prevascular and subcarinal lymph nodes. Lungs/Pleura: Diffuse ground-glass opacities throughout the lungs may reflect mild edema. Interstitial thickening in the lung bases. No effusions. Upper Abdomen: No acute findings Musculoskeletal: Chest wall soft tissues are unremarkable. No acute bony abnormality. Review of the MIP images confirms the above findings.  IMPRESSION: No evidence of pulmonary embolus. Cardiomegaly. Interstitial prominence and ground-glass opacities in the lungs or reflect mild interstitial edema. Borderline mediastinal lymph nodes, likely reactive. Aortic Atherosclerosis (ICD10-I70.0).  Prior CABG  Electronically Signed   By: Charlett Nose M.D.   On: 10/15/2021 17:17    Disposition   Pt is being discharged home today in good condition.  Follow-up Plans & Appointments     Follow-up Information     Lewayne Bunting, MD Follow up in 7 day(s).   Specialty: Cardiology Why: The office will contact you to schedule a follow up with a PA or NP for Dr. Jens Som. Contact information: [address] [place] [address] [phone]                Discharge Instructions     (HEART FAILURE PATIENTS) Call MD:  Anytime you have any of the following symptoms: 1) 3 pound weight gain in 24 hours or 5 pounds in 1 week 2) shortness of breath, with or without a dry hacking cough 3) swelling in the hands, feet or stomach 4) if you have to sleep on extra pillows at night in order to breathe.   Complete by: As directed    Ambulatory referral to Cardiology   Complete by: As directed    If you have not heard from the Cardiology office within the next 72 hours please call [phone].   Diet - low sodium heart healthy   Complete by: As directed    Increase activity slowly   Complete by: As directed        Discharge Medications   Allergies as of 10/17/2021       Reactions   Carvedilol  Itching   Lisinopril    Other reaction(s): Cough   Niacin Itching   Other reaction(s): Flushing   Oxycodone Other (See Comments)   hallucinations   Pravastatin    Other reaction(s): Muscle pain   Rosuvastatin    Other reaction(s): Muscle pain   Simvastatin    Other reaction(s): Liver enzymes abnormal        Medication List     STOP taking these medications    aspirin EC 325 MG tablet   HYDROmorphone 2 MG tablet Commonly known as: DILAUDID       TAKE these medications    acetaminophen 325 MG tablet Commonly known as: TYLENOL Take 975 mg by mouth every 6 (six) hours as needed for mild pain.   apixaban 5 MG Tabs  tablet Commonly known as: ELIQUIS Take 1 tablet (5 mg total) by mouth 2 (two) times daily.   docusate sodium 100 MG capsule Commonly known as: COLACE Take 1 capsule (100 mg total) by mouth 2 (two) times daily.   furosemide 40 MG tablet Commonly known as: LASIX Take 1 tablet (40 mg total) by mouth daily.   losartan 25 MG tablet Commonly known as: COZAAR Take 1 tablet (25 mg total) by mouth daily. Start taking on: October 18, 2021   metoprolol succinate 25 MG 24 hr tablet Commonly known as: TOPROL-XL Take 1 tablet (25 mg total) by mouth daily. Start taking on: October 18, 2021 What changed:  medication strength how much to take   spironolactone 25 MG tablet Commonly known as: ALDACTONE Take 0.5 tablets (12.5 mg total) by mouth daily. Start taking on: October 18, 2021           Outstanding Labs/Studies   BMET at follow up visit   Duration of Discharge Encounter   Greater than 30 minutes including physician time.  Signed, Tereso Newcomer, PA-C 10/17/2021, 5:39 PM  Cardiology attending  Patient seen and examined. Agree with above. See my rounding note as well.   Sharlot Gowda Taylor,MD

## 2021-10-17 NOTE — Progress Notes (Signed)
Progress Note  Patient Name: Douglas Hicks Date of Encounter: 10/17/2021  Primary Cardiologist: None   Subjective   Feels good. "Doc I got to get out of here. I will not stay another night."  Inpatient Medications    Scheduled Meds:  apixaban  5 mg Oral BID   Chlorhexidine Gluconate Cloth  6 each Topical Daily   losartan  25 mg Oral Daily   melatonin  3 mg Oral QHS   metoprolol succinate  12.5 mg Oral Daily   sodium chloride flush  3 mL Intravenous Q12H   spironolactone  12.5 mg Oral Daily   Continuous Infusions:  sodium chloride     amiodarone 30 mg/hr (10/17/21 0012)   PRN Meds: sodium chloride, acetaminophen, metoprolol tartrate, ondansetron (ZOFRAN) IV, sodium chloride flush   Vital Signs    Vitals:   10/16/21 2322 10/17/21 0401 10/17/21 0445 10/17/21 0836  BP: 134/68 119/64  116/63  Pulse:  100  (!) 103  Resp:    14  Temp: 98.9 F (37.2 C) 97.7 F (36.5 C)  98 F (36.7 C)  TempSrc: Oral Oral  Oral  SpO2: 94% 94%  93%  Weight:   68 kg   Height:        Intake/Output Summary (Last 24 hours) at 10/17/2021 1287 Last data filed at 10/17/2021 0400 Gross per 24 hour  Intake 782.72 ml  Output 1000 ml  Net -217.28 ml   Filed Weights   10/15/21 1626 10/16/21 1540 10/17/21 0445  Weight: 72.6 kg 68 kg 68 kg    Telemetry    Atrial fib with a controlled Vr - Personally Reviewed  ECG    none - Personally Reviewed  Physical Exam   GEN: elderly and diskempt, no acute distress.   Neck: No JVD Cardiac: IRIRR, no murmurs, rubs, or gallops.  Respiratory: Clear to auscultation bilaterally. GI: Soft, nontender, non-distended  MS: No edema; No deformity. Neuro:  Nonfocal  Psych: Normal affect   Labs    Chemistry Recent Labs  Lab 10/15/21 0846 10/16/21 0323  NA 137 137  K 4.6 4.2  CL 104 102  CO2 24 24  GLUCOSE 131* 135*  BUN 17 20  CREATININE 1.11 1.24  CALCIUM 8.6* 8.6*  PROT 6.8  --   ALBUMIN 3.1*  --   AST 18  --   ALT 13  --   ALKPHOS  61  --   BILITOT 1.5*  --   GFRNONAA >60 56*  ANIONGAP 9 11     Hematology Recent Labs  Lab 10/15/21 0846 10/16/21 0323  WBC 8.9 9.7  RBC 4.45 4.38  HGB 13.2 13.2  HCT 41.3 39.8  MCV 92.8 90.9  MCH 29.7 30.1  MCHC 32.0 33.2  RDW 13.4 13.3  PLT 261 274    Cardiac EnzymesNo results for input(s): "TROPONINI" in the last 168 hours. No results for input(s): "TROPIPOC" in the last 168 hours.   BNP Recent Labs  Lab 10/15/21 1600  BNP 579.0*     DDimer No results for input(s): "DDIMER" in the last 168 hours.   Radiology    ECHOCARDIOGRAM COMPLETE  Result Date: 10/16/2021    ECHOCARDIOGRAM REPORT   Patient Name:   Douglas Hicks Date of Exam: 10/16/2021 Medical Rec #:  [id number]       Height:       66.0 in Accession #:    [id number]      Weight:       160.0  lb Date of Birth:  07/07/1930      BSA:          1.819 m Patient Age:    88 years        BP:           136/85 mmHg Patient Gender: M               HR:           78 bpm. Exam Location:  Inpatient Procedure: 2D Echo, Cardiac Doppler and Color Doppler Indications:    Cardiomyopathy  History:        Patient has prior history of Echocardiogram examinations, most                 recent 10/31/2013. CAD; Arrythmias:Atrial Fibrillation.  Sonographer:    Roosvelt Maser RDCS Referring Phys: [id number] [facility] TANNU IMPRESSIONS  1. Left ventricular ejection fraction, by estimation, is 25-30%. The left ventricle has severely decreased function. The left ventricle demonstrates regional wall motion abnormalities (see scoring diagram/findings for description). There is akinesis of the left ventricular, basal-mid inferior wall and severe global hypokinesis.  2. Right ventricular systolic function is normal. The right ventricular size is normal. There is severely elevated pulmonary artery systolic pressure. The estimated right ventricular systolic pressure is 69.2 mmHg.  3. Right atrial size was mild to moderately dilated.  4. The mitral valve is normal in  structure. Mild mitral valve regurgitation. No evidence of mitral stenosis.  5. Tricuspid valve regurgitation is mild to moderate.  6. The aortic valve is normal in structure. Aortic valve regurgitation is trivial. No aortic stenosis is present.  7. Aortic dilatation noted. There is mild dilatation of the aortic root, measuring 39 mm.  8. The inferior vena cava is dilated in size with <50% respiratory variability, suggesting right atrial pressure of 15 mmHg. FINDINGS  Left Ventricle: Left ventricular ejection fraction, by estimation, is 25 to 30%. The left ventricle has severely decreased function. The left ventricle demonstrates regional wall motion abnormalities. The left ventricular internal cavity size was normal  in size. There is no left ventricular hypertrophy. Left ventricular diastolic function could not be evaluated due to atrial fibrillation. Left ventricular diastolic function could not be evaluated. Right Ventricle: The right ventricular size is normal. No increase in right ventricular wall thickness. Right ventricular systolic function is normal. There is severely elevated pulmonary artery systolic pressure. The tricuspid regurgitant velocity is 3.68 m/s, and with an assumed right atrial pressure of 15 mmHg, the estimated right ventricular systolic pressure is 69.2 mmHg. Left Atrium: Left atrial size was normal in size. Right Atrium: Right atrial size was mild to moderately dilated. Pericardium: There is no evidence of pericardial effusion. Mitral Valve: The mitral valve is normal in structure. Mild mitral valve regurgitation. No evidence of mitral valve stenosis. Tricuspid Valve: The tricuspid valve is normal in structure. Tricuspid valve regurgitation is mild to moderate. No evidence of tricuspid stenosis. Aortic Valve: The aortic valve is normal in structure. Aortic valve regurgitation is trivial. No aortic stenosis is present. Pulmonic Valve: The pulmonic valve was normal in structure. Pulmonic  valve regurgitation is not visualized. No evidence of pulmonic stenosis. Aorta: Aortic dilatation noted. There is mild dilatation of the aortic root, measuring 39 mm. Venous: The inferior vena cava is dilated in size with less than 50% respiratory variability, suggesting right atrial pressure of 15 mmHg. IAS/Shunts: No atrial level shunt detected by color flow Doppler.  LEFT VENTRICLE PLAX 2D LVIDd:  5.10 cm LVIDs:         4.40 cm LV PW:         1.00 cm LV IVS:        1.20 cm LVOT diam:     2.20 cm LV SV:         65 LV SV Index:   36 LVOT Area:     3.80 cm  LV Volumes (MOD) LV vol d, MOD A2C: 116.0 ml LV vol d, MOD A4C: 116.0 ml LV vol s, MOD A2C: 66.6 ml LV vol s, MOD A4C: 94.8 ml LV SV MOD A2C:     49.4 ml LV SV MOD A4C:     116.0 ml LV SV MOD BP:      35.9 ml RIGHT VENTRICLE            IVC RV Basal diam:  3.50 cm    IVC diam: 2.30 cm RV S prime:     7.42 cm/s TAPSE (M-mode): 1.0 cm LEFT ATRIUM             Index        RIGHT ATRIUM           Index LA diam:        4.20 cm 2.31 cm/m   RA Area:     24.10 cm LA Vol (A2C):   55.2 ml 30.35 ml/m  RA Volume:   68.10 ml  37.44 ml/m LA Vol (A4C):   50.2 ml 27.60 ml/m LA Biplane Vol: 53.9 ml 29.63 ml/m  AORTIC VALVE LVOT Vmax:   73.90 cm/s LVOT Vmean:  51.200 cm/s LVOT VTI:    0.172 m  AORTA Ao Root diam: 3.90 cm Ao Asc diam:  3.00 cm TRICUSPID VALVE TR Peak grad:   54.2 mmHg TR Vmax:        368.00 cm/s  SHUNTS Systemic VTI:  0.17 m Systemic Diam: 2.20 cm Armanda Magic MD Electronically signed by Armanda Magic MD Signature Date/Time: 10/16/2021/11:26:41 AM    Final    CT Abdomen Pelvis W Contrast  Result Date: 10/15/2021 CLINICAL DATA:  Upper abdominal pain, nausea EXAM: CT ABDOMEN AND PELVIS WITH CONTRAST TECHNIQUE: Multidetector CT imaging of the abdomen and pelvis was performed using the standard protocol following bolus administration of intravenous contrast. RADIATION DOSE REDUCTION: This exam was performed according to the departmental dose-optimization  program which includes automated exposure control, adjustment of the mA and/or kV according to patient size and/or use of iterative reconstruction technique. CONTRAST:  OMNIPAQUE IOHEXOL 350 MG/ML SOLN COMPARISON:  None Available. FINDINGS: Lower chest: See chest CT report Hepatobiliary: No focal hepatic abnormality. Gallbladder unremarkable. Pancreas: No focal abnormality or ductal dilatation. Fatty replacement. Spleen: No focal abnormality.  Normal size. Adrenals/Urinary Tract: 5.7 cm exophytic cyst off the lower pole of the right kidney. This appears benign. No follow-up imaging recommended. No renal or ureteral stones. No hydronephrosis. Adrenal glands and urinary bladder unremarkable. Stomach/Bowel: Sigmoid diverticulosis. No active diverticulitis. Stomach and small bowel decompressed, unremarkable. Vascular/Lymphatic: Aortic atherosclerosis. No evidence of aneurysm or adenopathy. Reproductive: Obscured by beam hardening artifact from bilateral hip replacements. Other: No free fluid or free air. Musculoskeletal: Bilateral replacements. Degenerative changes in the lumbar spine. No acute bony abnormality. IMPRESSION: Sigmoid diverticulosis.  No active diverticulitis. Aortic atherosclerosis. No acute findings in the abdomen or pelvis. Electronically Signed   By: Charlett Nose M.D.   On: 10/15/2021 17:20   CT Angio Chest PE W/Cm &/Or Wo Cm  Result Date:  10/15/2021 CLINICAL DATA:  Pulmonary embolism (PE) suspected, high prob. Upper abdominal pain EXAM: CT ANGIOGRAPHY CHEST WITH CONTRAST TECHNIQUE: Multidetector CT imaging of the chest was performed using the standard protocol during bolus administration of intravenous contrast. Multiplanar CT image reconstructions and MIPs were obtained to evaluate the vascular anatomy. RADIATION DOSE REDUCTION: This exam was performed according to the departmental dose-optimization program which includes automated exposure control, adjustment of the mA and/or kV according  to patient size and/or use of iterative reconstruction technique. CONTRAST:  OMNIPAQUE IOHEXOL 350 MG/ML SOLN COMPARISON:  None Available. FINDINGS: Cardiovascular: No filling defects in the pulmonary arteries to suggest pulmonary emboli. Cardiomegaly. Prior CABG. Aortic atherosclerosis. No aneurysm. Mediastinum/Nodes: Mildly prominent mediastinal lymph nodes. AP window lymph node has a short axis diameter of 12 mm. Similarly sized prevascular and subcarinal lymph nodes. Lungs/Pleura: Diffuse ground-glass opacities throughout the lungs may reflect mild edema. Interstitial thickening in the lung bases. No effusions. Upper Abdomen: No acute findings Musculoskeletal: Chest wall soft tissues are unremarkable. No acute bony abnormality. Review of the MIP images confirms the above findings. IMPRESSION: No evidence of pulmonary embolus. Cardiomegaly. Interstitial prominence and ground-glass opacities in the lungs or reflect mild interstitial edema. Borderline mediastinal lymph nodes, likely reactive. Aortic Atherosclerosis (ICD10-I70.0).  Prior CABG Electronically Signed   By: Charlett Nose M.D.   On: 10/15/2021 17:17    Cardiac Studies   See above  Patient Profile     88 y.o. male admitted with acute on chronic sytolic heart failure and atrial fib with a RVR.   Assessment & Plan    Acute on chronic systolic heart failure - he is much improved. He can be discharged on lasix 40 mg daily, aldactone 12.5 mg daily, toprol 25 mg daily, and losartan 25 mg daily. Atrial fib - this is a chronic problem. We will stop IV amiodarone. His dose of toprol can be titrated up as an outpatient. CAD - he denies anginal symptoms.  Coags - dc home on eliquis 5 mg twice daily. Dyslipidemia - consideration for repatha can be made as an outpatient as per Dr. Marsa Aris. Not sure with his advanced age if Repatha really indicated.  [facility] will sign off.   Medication Recommendations:  see above Other recommendations (labs,  testing, etc):  none Follow up as an outpatient:  follow with an APP visit in 7 days.   For questions or updates, please contact [facility] Please consult [email or web address] for contact info under Cardiology/STEMI.      Signed, Lewayne Bunting, MD  10/17/2021, 9:38 AM

## 2021-10-18 ENCOUNTER — Encounter: Payer: Self-pay | Admitting: Cardiovascular Disease

## 2021-10-18 NOTE — Progress Notes (Deleted)
Cardiology Office Note:    Date:  10/18/2021   ID:  Douglas Hicks, [place] Nov 07, 1930, MRN [id number]  PCP:  [facility]   [facility] Providers Cardiologist:  Olga Millers, MD { Click to update primary MD,subspecialty MD or APP then REFRESH:1}    Referring MD: Peter Garter, PA   No chief complaint on file. ***  History of Present Illness:    Douglas Hicks is a 88 y.o. male with a hx of ***  Past Medical History:  Diagnosis Date   Arthritis    right and left hip   CHF (congestive heart failure) (HCC)    Heart murmur     Past Surgical History:  Procedure Laterality Date   ARTERIAL BYPASS SURGRY  2001   right knee   LUMBAR LAMINECTOMY/DECOMPRESSION MICRODISCECTOMY Bilateral 07/08/2021   Procedure: BILATERAL Lumbar three-four, Lumbar four-five LAMINOTOMY,FORAMINOTOMY;  Surgeon: Tressie Stalker, MD;  Location: [facility] OR;  Service: Neurosurgery;  Laterality: Bilateral;   MEDIAL PARTIAL KNEE REPLACEMENT  2009   TOTAL HIP ARTHROPLASTY  2001   left hip    Current Medications: No outpatient medications have been marked as taking for the 10/19/21 encounter (Appointment) with Bailyn Spackman, Deloris Ping, MD.     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  ROS:   Please see the history of present illness.    *** All other systems reviewed and are negative.  EKGs/Labs/Other Studies Reviewed:    The following studies were reviewed today: ***  EKG:  EKG is *** ordered today.  The ekg ordered today demonstrates ***  Recent Labs: 10/15/2021: ALT 13; B Natriuretic Peptide 579.0 10/16/2021: BUN 20; Creatinine, Ser 1.24; Hemoglobin 13.2; Platelets 274; Potassium 4.2; Sodium 137; TSH 1.766  Recent Lipid Panel No results found for: "CHOL", "TRIG", "HDL", "CHOLHDL", "VLDL", "LDLCALC", "LDLDIRECT"   Risk Assessment/Calculations:   {Does this patient have ATRIAL FIBRILLATION?:[phone]}       Physical Exam:    VS:  There were no vitals taken for this visit.    Wt Readings from Last 3 Encounters:  10/17/21 149 lb 14.6 oz (68 kg)  07/08/21 165 lb (74.8 kg)  10/31/13 184 lb (83.5 kg)     GEN: *** Well nourished, well developed in no acute distress HEENT: Normal NECK: No JVD; No carotid bruits LYMPHATICS: No lymphadenopathy CARDIAC: ***RRR, no murmurs, rubs, gallops  RESPIRATORY:  Clear to auscultation without rales, wheezing or rhonchi  ABDOMEN: Soft, non-tender, non-distended MUSCULOSKELETAL:  No edema; No deformity  SKIN: Warm and dry NEUROLOGIC:  Alert and oriented x 3 PSYCHIATRIC:  Normal affect   ASSESSMENT:    No diagnosis found. PLAN:    In order of problems listed above:  ***      {Are you ordering a CV Procedure (e.g. stress test, cath, DCCV, TEE, etc)?   Press F2        :[id number]}    Medication Adjustments/Labs and Tests Ordered: Current medicines are reviewed at length with the patient today.  Concerns regarding medicines are outlined above.  No  orders of the defined types were placed in this encounter.  No orders of the defined types were placed in this encounter.   There are no Patient Instructions on file for this visit.   Signed, Kristeen Miss, MD  10/18/2021 6:09 PM    [facility]

## 2021-10-19 ENCOUNTER — Telehealth: Payer: Self-pay | Admitting: Cardiology

## 2021-10-19 ENCOUNTER — Ambulatory Visit: Payer: Self-pay | Admitting: Cardiovascular Disease

## 2021-10-19 NOTE — Telephone Encounter (Signed)
Called patient, LVM to call back, phone disconnected.

## 2021-10-19 NOTE — Telephone Encounter (Signed)
  Patient has a TOC hospital f/u on 10/26/21 at 10:55 am with Edd Fabian per Tereso Newcomer

## 2021-10-21 NOTE — Telephone Encounter (Signed)
Left message on home phone answer phone to call back - speak to triage. TOC  questions.  If he does not return call  will close encounter.

## 2021-10-22 NOTE — Telephone Encounter (Signed)
Regarding TOC, LMTCB.

## 2021-10-23 ENCOUNTER — Ambulatory Visit: Payer: Self-pay | Admitting: Physician Assistant

## 2023-12-01 IMAGING — CR DG LUMBAR SPINE 1V
1 series · 1 of 1 positions shown · non-contrast
Comparison: None Available.

CLINICAL DATA: Intraoperative imaging for localization

EXAM:
LUMBAR SPINE - 1 VIEW

[lateral]
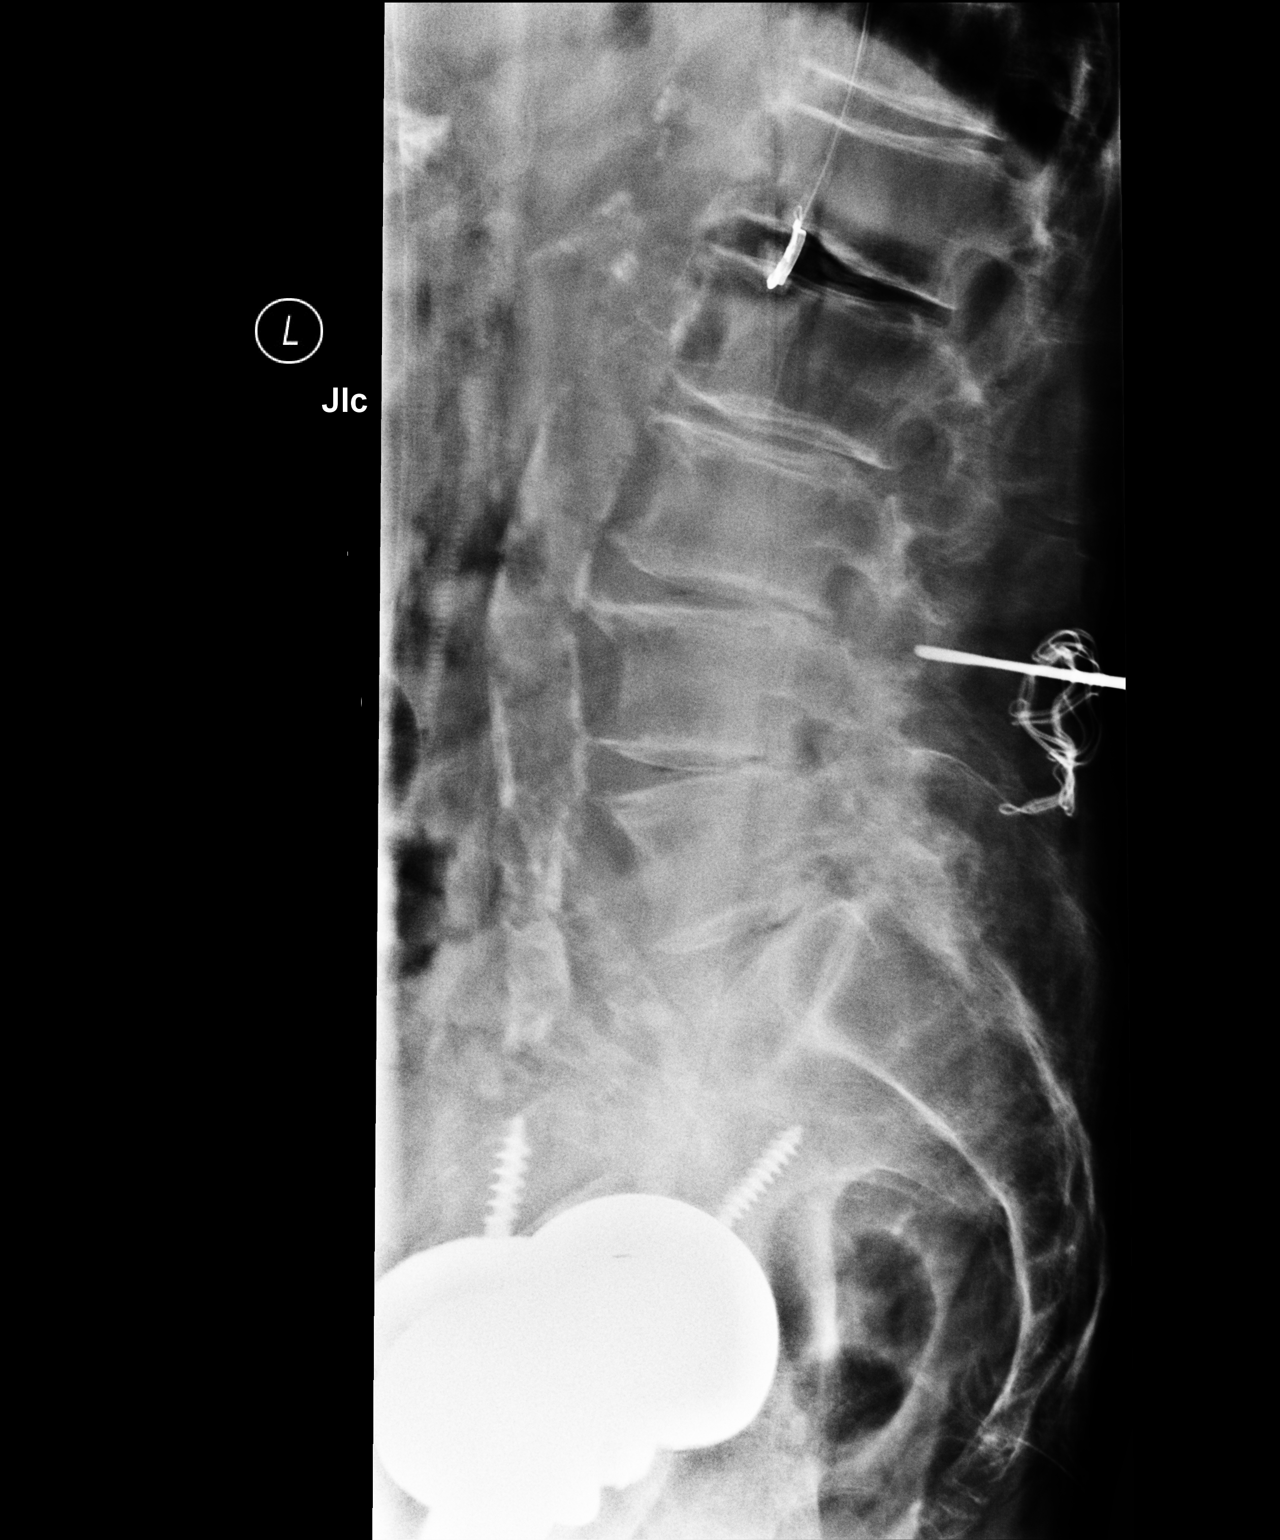

[1 of 1 positions shown; findings below may reference images not displayed]

FINDINGS: Cross-table lateral demonstrates localizer dorsal to the L3-L4 level
overlying the posterior elements.
IMPRESSION: Intraoperative localization.
